# Patient Record
Sex: Female | Born: 1945 | Race: White | Hispanic: No | Marital: Married | Smoking: Never smoker
Health system: Southern US, Community
[De-identification: ages and names within clinical notes are randomized; demographics above are authoritative.]

## PROBLEM LIST (undated history)

## (undated) DIAGNOSIS — I4891 Unspecified atrial fibrillation: Secondary | ICD-10-CM

## (undated) DIAGNOSIS — R0600 Dyspnea, unspecified: Secondary | ICD-10-CM

## (undated) DIAGNOSIS — I509 Heart failure, unspecified: Secondary | ICD-10-CM

## (undated) DIAGNOSIS — I10 Essential (primary) hypertension: Secondary | ICD-10-CM

---

## 2021-10-05 ENCOUNTER — Other Ambulatory Visit: Payer: Self-pay

## 2021-10-05 ENCOUNTER — Emergency Department (HOSPITAL_BASED_OUTPATIENT_CLINIC_OR_DEPARTMENT_OTHER): Payer: Medicare HMO

## 2021-10-05 ENCOUNTER — Encounter (HOSPITAL_BASED_OUTPATIENT_CLINIC_OR_DEPARTMENT_OTHER): Payer: Self-pay

## 2021-10-05 ENCOUNTER — Emergency Department (HOSPITAL_BASED_OUTPATIENT_CLINIC_OR_DEPARTMENT_OTHER)
Admission: EM | Admit: 2021-10-05 | Discharge: 2021-10-05 | Disposition: A | Discharge status: Home / Self Care | Payer: Medicare HMO | Attending: Emergency Medicine | Admitting: Emergency Medicine

## 2021-10-05 DIAGNOSIS — L03115 Cellulitis of right lower limb: Secondary | ICD-10-CM | POA: Diagnosis not present

## 2021-10-05 DIAGNOSIS — M7989 Other specified soft tissue disorders: Secondary | ICD-10-CM | POA: Diagnosis present

## 2021-10-05 DIAGNOSIS — R0902 Hypoxemia: Secondary | ICD-10-CM | POA: Diagnosis present

## 2021-10-05 HISTORY — DX: Dyspnea, unspecified: R06.00

## 2021-10-05 HISTORY — DX: Unspecified atrial fibrillation: I48.91

## 2021-10-05 HISTORY — DX: Essential (primary) hypertension: I10

## 2021-10-05 HISTORY — DX: Heart failure, unspecified: I50.9

## 2021-10-05 LAB — CBC WITH DIFFERENTIAL/PLATELET
Abs Immature Granulocytes: 0.06 10*3/uL (ref 0.00–0.07)
Basophils Absolute: 0 10*3/uL (ref 0.0–0.1)
Basophils Relative: 0 %
Eosinophils Absolute: 0.1 10*3/uL (ref 0.0–0.5)
Eosinophils Relative: 1 %
HCT: 36.3 % (ref 36.0–46.0)
Hemoglobin: 11.6 g/dL — ABNORMAL LOW (ref 12.0–15.0)
Immature Granulocytes: 1 %
Lymphocytes Relative: 13 %
Lymphs Abs: 1.1 10*3/uL (ref 0.7–4.0)
MCH: 29.1 pg (ref 26.0–34.0)
MCHC: 32 g/dL (ref 30.0–36.0)
MCV: 91.2 fL (ref 80.0–100.0)
Monocytes Absolute: 0.6 10*3/uL (ref 0.1–1.0)
Monocytes Relative: 7 %
Neutro Abs: 6.8 10*3/uL (ref 1.7–7.7)
Neutrophils Relative %: 78 %
Platelets: 177 10*3/uL (ref 150–400)
RBC: 3.98 MIL/uL (ref 3.87–5.11)
RDW: 15.6 % — ABNORMAL HIGH (ref 11.5–15.5)
WBC: 8.6 10*3/uL (ref 4.0–10.5)
nRBC: 0 % (ref 0.0–0.2)

## 2021-10-05 LAB — BASIC METABOLIC PANEL
Anion gap: 5 (ref 5–15)
BUN: 17 mg/dL (ref 8–23)
CO2: 29 mmol/L (ref 22–32)
Calcium: 9 mg/dL (ref 8.9–10.3)
Chloride: 105 mmol/L (ref 98–111)
Creatinine, Ser: 0.82 mg/dL (ref 0.44–1.00)
GFR, Estimated: >60 mL/min (ref >60)
Glucose, Bld: 134 mg/dL — ABNORMAL HIGH (ref 70–99)
Potassium: 3.6 mmol/L (ref 3.5–5.1)
Sodium: 139 mmol/L (ref 135–145)

## 2021-10-05 MED ORDER — DOXYCYCLINE HYCLATE 100 MG PO CAPS: 100.0000 mg | ORAL_CAPSULE | Freq: Two times a day (BID) | ORAL | 0 refills | Status: DC

## 2021-10-05 NOTE — ED Triage Notes (Signed)
Right knee wound x 1 year - Patient states it is a little more swollen the last two days.  ? ?Patient states yesterday there was more swelling and a color change on the shin. Patient states pain was much worse last night.  ?

## 2021-10-05 NOTE — Discharge Instructions (Addendum)
Follow-up with wound care.  Next week as scheduled.  Take the antibiotic doxycycline for the next 7 days.  Ultrasound study showed no blood clots in the right lower extremity.  Return for any new or worse symptoms.  Follow-up with your doctors as needed. ?

## 2021-10-05 NOTE — ED Provider Notes (Addendum)
?[facility] EMERGENCY DEPARTMENT ?Provider Note ? ? ?CSN: [id number] ?Arrival date & time: 10/05/21  1130 ? ?  ? ?History ? ?No chief complaint on file. ? ? ?Angie Figueroa is a 76 y.o. female. ? ?Patient sent in by wound care for possible DVT.  Patient contacted the nurse about increased swelling and redness to the right lower extremity.  Patient's been being followed by wound care for the past 3 weeks due to a nonhealing ulcer proximal leg area just below the knee.  It is getting a topical antibiotics.  Patient states has been increased redness to the leg lower into the foot for the past 2 weeks.  And wound care week ago saw some of these changes.  Patient denies any fever or chills.  States that she has had some increased swelling and some increased redness.  And some slight increase in pain.  Denies any chest pain or shortness of breath. ? ?Patient is on Eliquis for atrial fibs cardiac monitor shows that she is in sinus rhythm currently.  Patient also seen for hypertension and congestive heart failure. ? ? ?  ? ?Home Medications ?Prior to Admission medications   ?Not on File  ?   ? ?Allergies    ?Patient has no known allergies.   ? ?Review of Systems   ?Review of Systems  ?Constitutional:  Negative for chills and fever.  ?HENT:  Negative for ear pain and sore throat.   ?Eyes:  Negative for pain and visual disturbance.  ?Respiratory:  Negative for cough and shortness of breath.   ?Cardiovascular:  Positive for leg swelling. Negative for chest pain and palpitations.  ?Gastrointestinal:  Negative for abdominal pain and vomiting.  ?Genitourinary:  Negative for dysuria and hematuria.  ?Musculoskeletal:  Negative for arthralgias and back pain.  ?Skin:  Positive for wound. Negative for color change and rash.  ?Neurological:  Negative for seizures and syncope.  ?All other systems reviewed and are negative. ? ?Physical Exam ?Updated Vital Signs ?BP (!) 152/71   Pulse 76   Temp 98.7 ?F (37.1 ?C) (Oral)    Resp (!) 25   Ht 1.626 m (5\' 4" )   Wt 106.6 kg   SpO2 91%   BMI 40.34 kg/m?  ?Physical Exam ?Vitals and nursing note reviewed.  ?Constitutional:   ?   General: She is not in acute distress. ?   Appearance: Normal appearance. She is well-developed. She is not ill-appearing.  ?HENT:  ?   Head: Normocephalic and atraumatic.  ?Eyes:  ?   Conjunctiva/sclera: Conjunctivae normal.  ?Cardiovascular:  ?   Rate and Rhythm: Normal rate and regular rhythm.  ?   Heart sounds: No murmur heard. ?Pulmonary:  ?   Effort: Pulmonary effort is normal. No respiratory distress.  ?   Breath sounds: Normal breath sounds. No wheezing, rhonchi or rales.  ?Abdominal:  ?   Palpations: Abdomen is soft.  ?   Tenderness: There is no abdominal tenderness.  ?Musculoskeletal:     ?   General: Tenderness present. No swelling.  ?   Cervical back: Normal range of motion and neck supple.  ?   Right lower leg: Edema present.  ?   Left lower leg: Edema present.  ?   Comments: Wound just distal to the right knee no significant erythema there.  Wound bed looks good.  But there is lots of erythema below that on the anterior shin with increased warmth down to the level of the ankle little bit  of erythema on the feet patient states has been there for 2 weeks.  Dorsalis pedis pulse both feet is 2+.  Good cap refill to both feet.  Increased swelling to the right lower extremity compared to the left.  No significant erythema to the anterior part of the left leg.  ?Skin: ?   General: Skin is warm and dry.  ?   Capillary Refill: Capillary refill takes less than 2 seconds.  ?Neurological:  ?   General: No focal deficit present.  ?   Mental Status: She is alert and oriented to person, place, and time.  ?Psychiatric:     ?   Mood and Affect: Mood normal.  ? ? ?ED Results / Procedures / Treatments   ?Labs ?(all labs ordered are listed, but only abnormal results are displayed) ?Labs Reviewed  ?CBC WITH DIFFERENTIAL/PLATELET - Abnormal; Notable for the following  components:  ?    Result Value  ? Hemoglobin 11.6 (*)   ? RDW 15.6 (*)   ? All other components within normal limits  ?BASIC METABOLIC PANEL  ? ? ?EKG ?EKG Interpretation ? ?Date/Time:  Thursday Oct 05 2021 11:55:56 EDT ?Ventricular Rate:  78 ?PR Interval:  221 ?QRS Duration: 108 ?QT Interval:  427 ?QTC Calculation: 487 ?R Axis:   -24 ?Text Interpretation: Sinus rhythm Prolonged PR interval Borderline left axis deviation Borderline prolonged QT interval No previous ECGs available Confirmed by Vanetta Mulders [phone]) on 10/05/2021 12:02:08 PM ? ?Radiology ?No results found. ? ?Procedures ?Procedures  ? ? ?Medications Ordered in ED ?Medications - No data to display ? ?ED Course/ Medical Decision Making/ A&P ?  ?                        ?Medical Decision Making ?Amount and/or Complexity of Data Reviewed ?Labs: ordered. ? ? ?Clinically seems to have a cellulitis to the right anterior leg.  Will probably be amenable to oral antibiotics if the deep vein thrombosis study is negative.  Patient has not been on antibiotics recently.  Just been getting topical antibiotic to the wound that is just distal to the right part of the knee. ? ?We will get Doppler study.  I will get basic labs CBC and basic metabolic panel. ? ?Nursing informing that patient was placed on 2 L of oxygen for oxygen sats at 91%.  Took the patient off the oxygen.  Oxygen sats stay above 90%.  Patient without any significant plaint of shortness of breath at this time and did not have any prior.  But says she was slumped over at the time that she felt a little short of breath. ? ?Patient's Doppler studies were negative. ? ?Patient's CBC normal no leukocytosis hemoglobin 11.6.  Basic metabolic panel normal renal function normal. ? ?We will treat patient for probable cellulitis and has she has follow-up with wound care scheduled for next week.  We will put her on doxycycline for 7 days.  Patient wants a prescription sent to the [facility]. ? ?Final  Clinical Impression(s) / ED Diagnoses ?Final diagnoses:  ?Cellulitis of right lower extremity  ? ? ?Rx / DC Orders ?ED Discharge Orders   ? ? None  ? ?  ? ? ?  ?Vanetta Mulders, MD ?10/05/21 1317 ? ?  ?Vanetta Mulders, MD ?10/05/21 1445 ? ?

## 2021-10-05 NOTE — Progress Notes (Signed)
RT in to assess pt due to endorsing shortness of breath. Upon arrival pt in no distress and pt able to speak in complete sentences. Pt repositioned in bed with RN. SpO2 as low as 87% on room air but quick to rise to 90-91%. Pt placed on 2L nasal cannula at this time. Fine crackles heard bilaterally. RT will continue to monitor and be available as needed.  ?

## 2021-10-05 NOTE — ED Notes (Signed)
Dc instructions and scripts reviewed with pt no questions or concerns at this time. Will follow up with wound care and pcp. Pt wheeled out to vehicle and transported home by spouse.  ?

## 2021-10-05 NOTE — ED Notes (Signed)
EDP at bedside  

## 2022-03-22 ENCOUNTER — Emergency Department (HOSPITAL_BASED_OUTPATIENT_CLINIC_OR_DEPARTMENT_OTHER)
Admission: EM | Admit: 2022-03-22 | Discharge: 2022-03-22 | Disposition: A | Discharge status: Home / Self Care | Payer: Medicare HMO | Attending: Emergency Medicine | Admitting: Emergency Medicine

## 2022-03-22 ENCOUNTER — Encounter (HOSPITAL_BASED_OUTPATIENT_CLINIC_OR_DEPARTMENT_OTHER): Payer: Self-pay | Admitting: Emergency Medicine

## 2022-03-22 ENCOUNTER — Other Ambulatory Visit: Payer: Self-pay

## 2022-03-22 DIAGNOSIS — I509 Heart failure, unspecified: Secondary | ICD-10-CM | POA: Diagnosis not present

## 2022-03-22 DIAGNOSIS — S0232XA Fracture of orbital floor, left side, initial encounter for closed fracture: Secondary | ICD-10-CM | POA: Diagnosis not present

## 2022-03-22 DIAGNOSIS — Y92003 Bedroom of unspecified non-institutional (private) residence as the place of occurrence of the external cause: Secondary | ICD-10-CM | POA: Diagnosis not present

## 2022-03-22 DIAGNOSIS — I110 Hypertensive heart disease with heart failure: Secondary | ICD-10-CM | POA: Insufficient documentation

## 2022-03-22 DIAGNOSIS — W06XXXA Fall from bed, initial encounter: Secondary | ICD-10-CM | POA: Insufficient documentation

## 2022-03-22 DIAGNOSIS — Z7901 Long term (current) use of anticoagulants: Secondary | ICD-10-CM | POA: Diagnosis not present

## 2022-03-22 DIAGNOSIS — S0592XA Unspecified injury of left eye and orbit, initial encounter: Secondary | ICD-10-CM | POA: Diagnosis present

## 2022-03-22 NOTE — ED Notes (Addendum)
Pt awake and alert- GCS 15- speech clear.  L eye periorbital edema with ecchymosis noted --  L upper facial region sutures intact; site cleansed with light saline then covered with light coat of Bacitracin- site covered with covered with dry gauze and secured with tape (wound care by ED tech) -- well tolerated by pt.  Pt does not verbalize any c/o vision changes or pain -- RR even and unlabored on RA with symmetrical rise and fall of chest.  Pt and spouse, at bedside, agreeable with d/c plan as discussed by provider - pt escorted to exit via w/c by ED tech

## 2022-03-22 NOTE — ED Provider Notes (Signed)
[facility] EMERGENCY DEPARTMENT Provider Note   CSN: [id number] Arrival date & time: 03/22/22  1752     History  Chief Complaint  Patient presents with   Facial Injury   Eye Injury    Angie Figueroa is a 76 y.o. female.  Patient is a 76 year old female with a history of atrial fibrillation, hypertension, CHF who is currently on Eliquis and presents today for follow-up after an injury yesterday.  Patient was on vacation in the [place] when she accidentally rolled out of bed hitting the left side of her face on an end table in the floor.  She was seen at an emergency room in the [facility] and at that time had a CT scan done of her face that showed inferior orbital wall fracture with some blood in her sinus but no obvious extraocular entrapment.  There was no evidence of globe rupture.  Patient's wound was repaired in the emergency room and at that time they wanted to transfer her to [place] for specialist evaluation.  They noted that she had normal extraocular movements at that time but her eye was very swollen and they did not feel that they could get an accurate visual exam.  Patient choose to leave AMA and came here for follow-up today.  She reports overall she feels okay.  She feels like the swelling has improved.  She has had no nausea or vomiting.  The history is provided by the patient and medical records.  Facial Injury Eye Injury       Home Medications Prior to Admission medications   Medication Sig Start Date End Date Taking? Authorizing Provider  doxycycline (VIBRAMYCIN) 100 MG capsule Take 1 capsule (100 mg total) by mouth 2 (two) times daily. 10/05/21   Fredia Sorrow, MD      Allergies    Pregabalin    Review of Systems   Review of Systems  Physical Exam Updated Vital Signs BP (!) 157/61 (BP Location: Left Arm)   Pulse 70   Temp 98 F (36.7 C) (Oral)   Resp 17   Ht 5\' 4"  (1.626 m)   Wt 107 kg   SpO2 96%   BMI 40.49 kg/m  Physical  Exam Vitals and nursing note reviewed.  Constitutional:      Appearance: Normal appearance.  HENT:     Head:     Comments: Laceration repaired with multiple sutures underneath the left eye.  Surrounding ecchymosis of the upper and lower lid.  Dried blood in the corner of the eye. Eyes:     Extraocular Movements: Extraocular movements intact.     Pupils: Pupils are equal, round, and reactive to light.     Comments: Normal extraocular movements of the left eye.  Mild subconjunctival hemorrhage present.  Pupils are reactive bilaterally and 3 cm.  Grossly intact vision per patient  Skin:    General: Skin is warm.  Neurological:     Mental Status: She is alert and oriented to person, place, and time. Mental status is at baseline.  Psychiatric:        Mood and Affect: Mood normal.     ED Results / Procedures / Treatments   Labs (all labs ordered are listed, but only abnormal results are displayed) Labs Reviewed - No data to display  EKG None  Radiology No results found.  Procedures Procedures    Medications Ordered in ED Medications - No data to display  ED Course/ Medical Decision Making/ A&P  Medical Decision Making  Patient presenting today for recheck after a fall yesterday and found to have orbital floor fracture.  Patient has no further complaints today except for the wound had been bleeding where they could not put a stitch.  She feels like her vision is okay as long as her eyelid is moved out of the way.  She is not having significant eye pain.  Based on outside records imaging from yesterday shows fracture but no signs of entrapment or open globe.  Patient's vision is intact here and extraocular movements are intact.  Feel that she will need follow-up with maxillofacial team but does not need any acute intervention today.  Wound care applied.  Findings discussed with the patient and her husband.  No indication for further imaging at this time.   Patient is well-appearing otherwise and feel that she is stable for discharge home.  She was given instructions on how to manage her wound and when sutures need to be removed.        Final Clinical Impression(s) / ED Diagnoses Final diagnoses:  Closed fracture of left orbital floor, initial encounter [facility], [place])    Rx / DC Orders ED Discharge Orders     None         Gwyneth Sprout, MD 03/22/22 2005

## 2022-03-22 NOTE — ED Triage Notes (Addendum)
Pt reports rolling off of a bed and hitting her left eye/face on the corner of the bedside table. Fall occurred yesterday around 4 pm. Denies LOC. States she was seen at another ER in [facility], [place] yesterday. Received ~ 12 stiches in the left side of her face and told there was a closed left orbital fracture. That ER was trying to send pt to another hospital in [place] for ophthalmology consult, but pt signed out [place]. Here today for recheck on injury.   Left eye swollen with hematoma. Sutures covered with bandage. + blood thinners (Eliquis) - pt did take todays dose.

## 2022-03-22 NOTE — Discharge Instructions (Signed)
It is okay to take a shower and it is okay to get the stitches wet just do not soak them or scrub on them.  You can place a bandage or you can leave it open to the air.  Just put some Vaseline over it.  The stitches need to come out in 5 days.  Also it is important that you follow-up with the specialist in 1 to 2 weeks just to make sure everything is healing well and that your vision is unchanged.

## 2022-12-25 IMAGING — US US EXTREM LOW VENOUS*R*
1 series · 14 of 24 positions shown · non-contrast
Comparison: None Available.

CLINICAL DATA: Right knee nonhealing wound.

EXAM:
RIGHT LOWER EXTREMITY VENOUS DOPPLER ULTRASOUND
TECHNIQUE: Gray-scale sonography with compression, as well as color and duplex
ultrasound, were performed to evaluate the deep venous system(s)
from the level of the common femoral vein through the popliteal and
proximal calf veins.

[Series 1: us extrem low venous*right* · 14 of 41 slices shown]
[im 1/41]
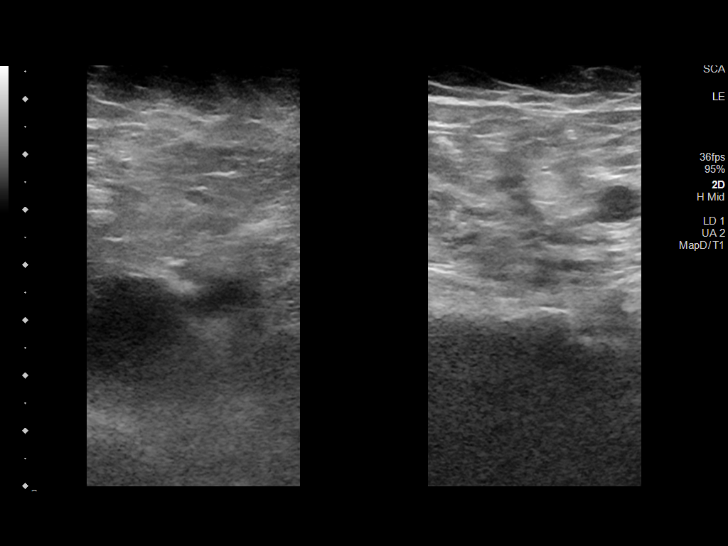
[im 4/41]
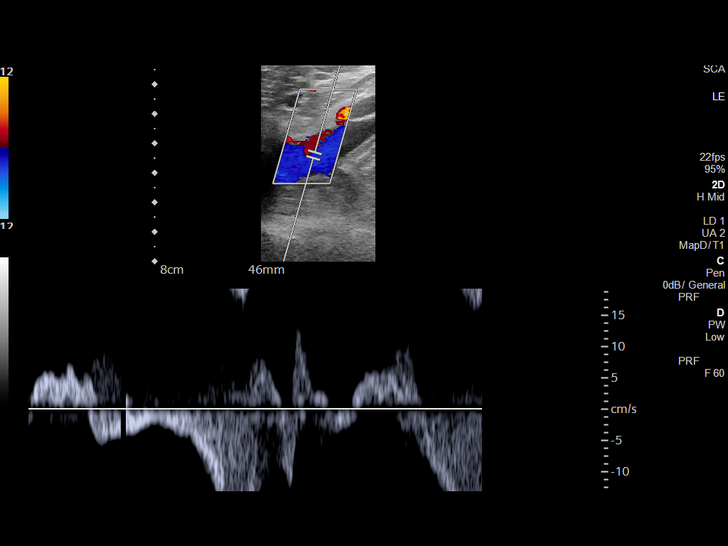
[im 7/41]
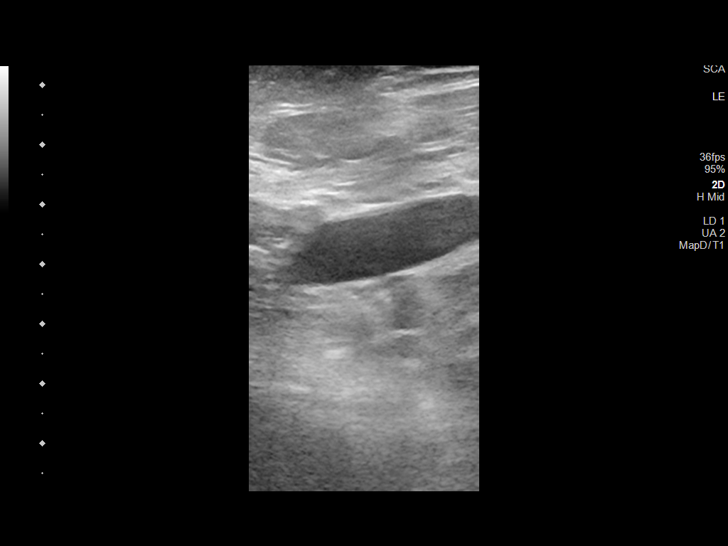
[im 11/41]
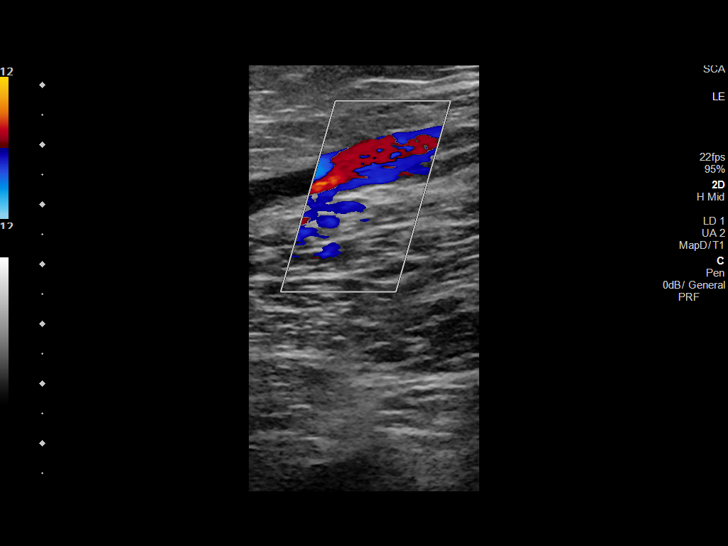
[im 13/41]
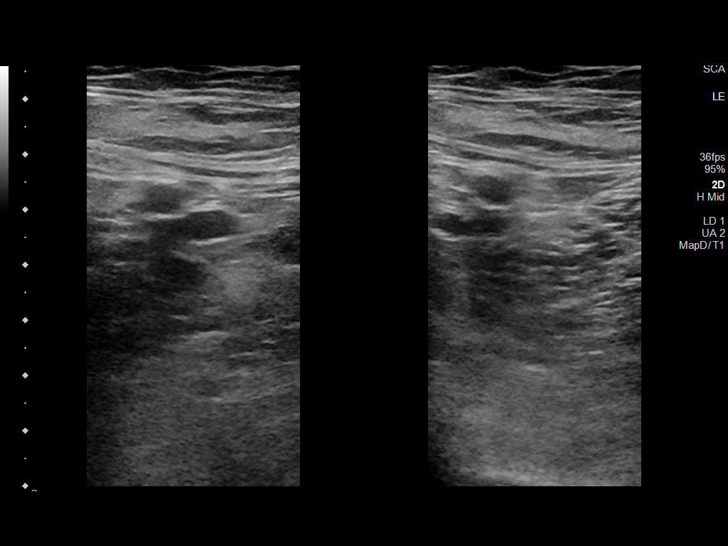
[im 16/41]
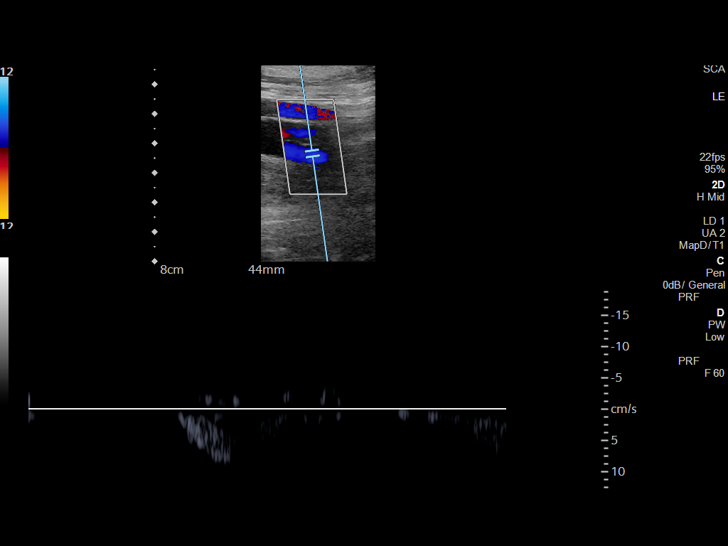
[im 20/41]
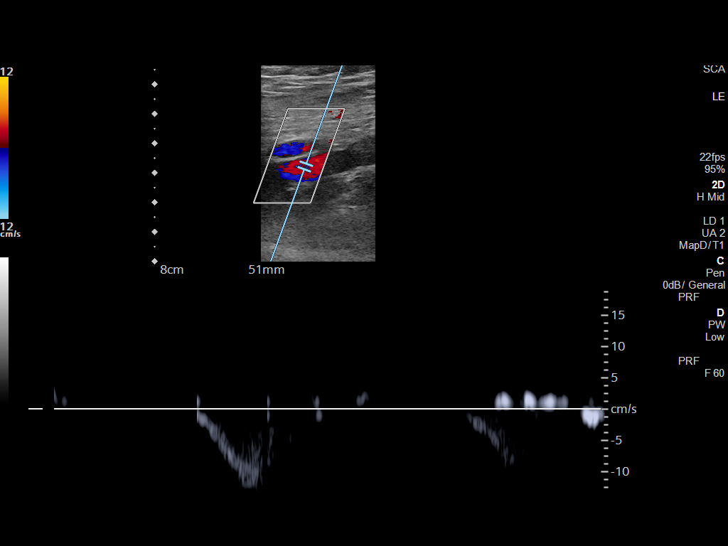
[im 21/41]
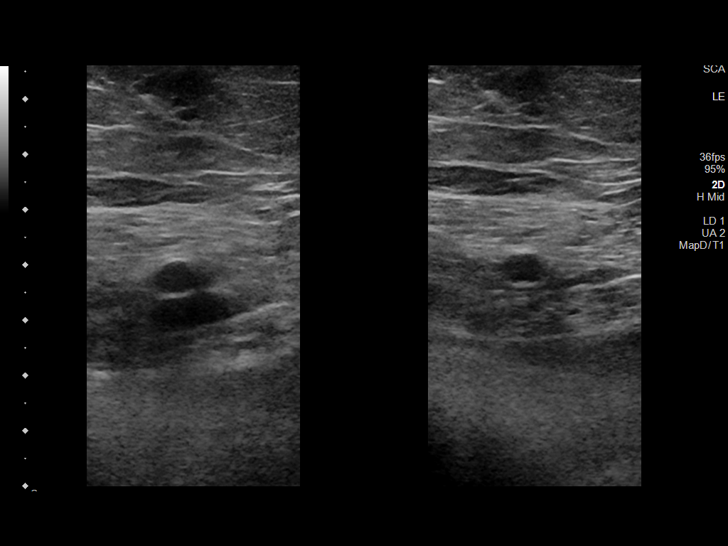
[im 25/41]
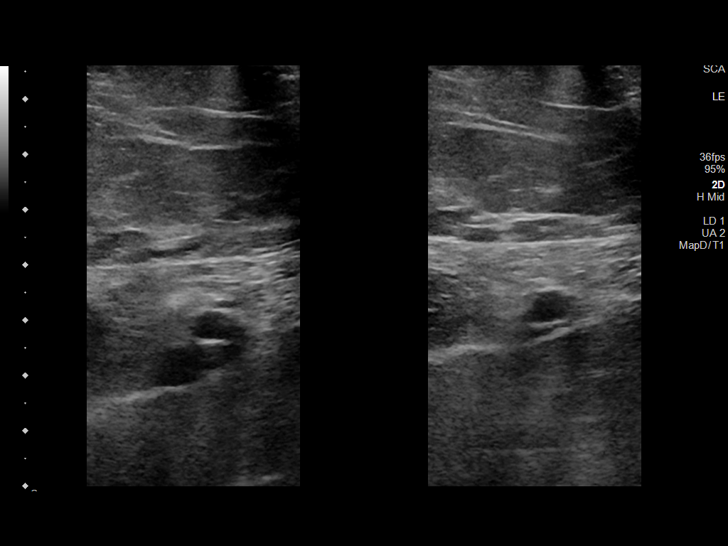
[im 28/41]
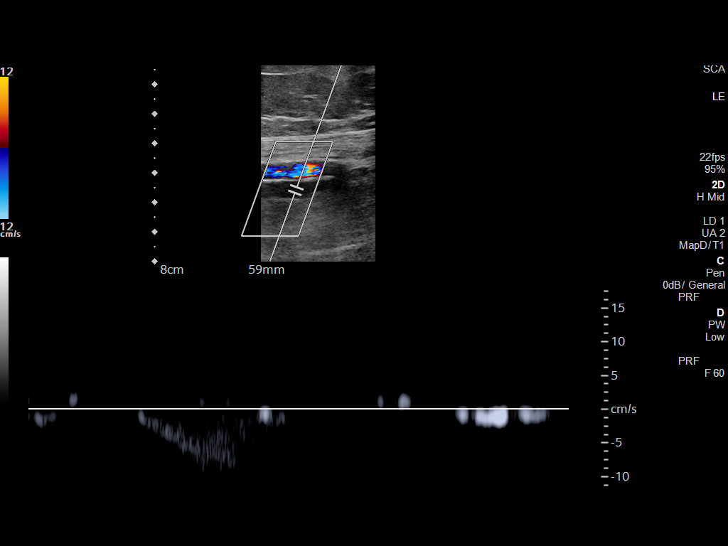
[im 32/41]
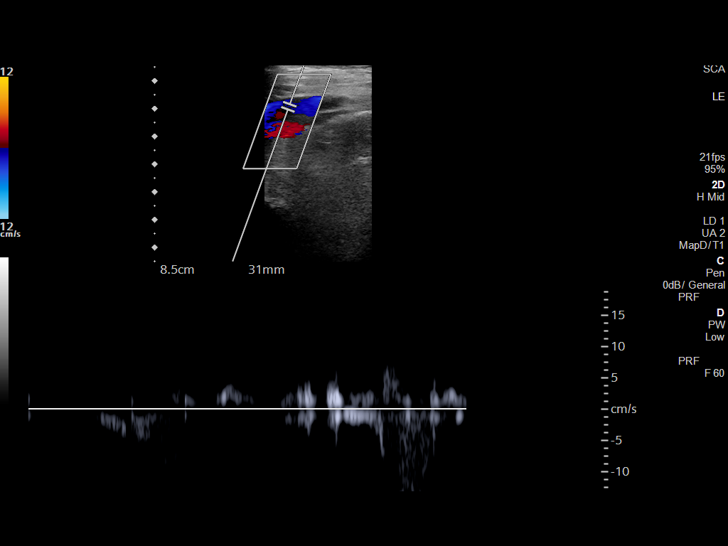
[im 34/41]
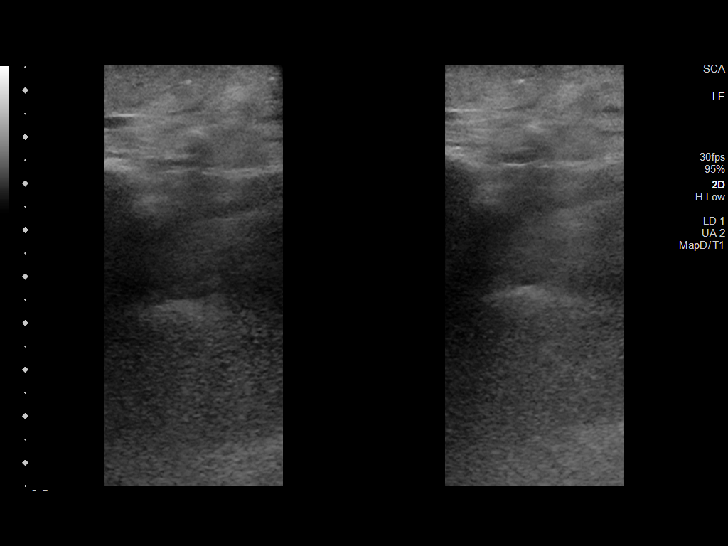
[im 37/41]
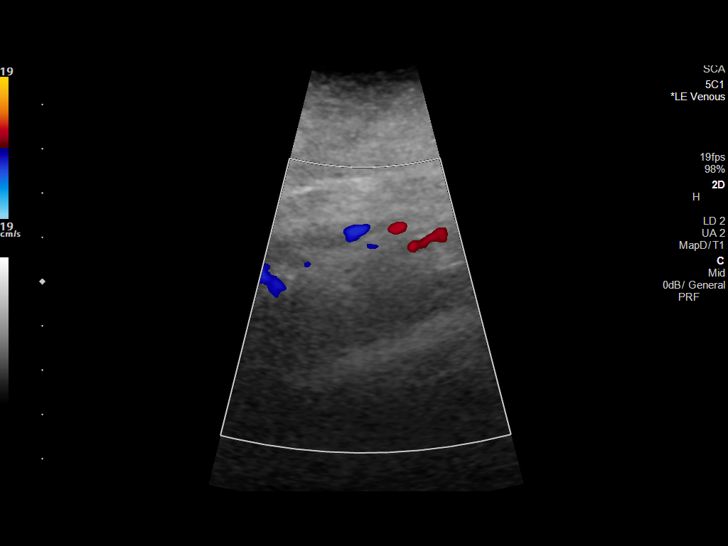
[im 41/41]
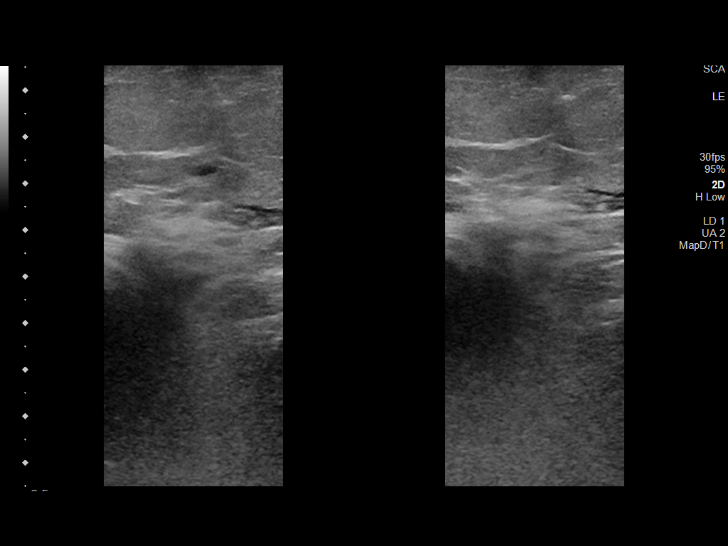

[14 of 24 positions shown; findings below may reference images not displayed]

FINDINGS: VENOUS

Normal compressibility of the common femoral, superficial femoral,
and popliteal veins, as well as the visualized calf veins. Of note,
there is poor visualization of the calf veins. Visualized portions
of profunda femoral vein and great saphenous vein unremarkable. No
filling defects to suggest DVT on grayscale or color Doppler
imaging. Doppler waveforms show normal direction of venous flow,
normal respiratory plasticity and response to augmentation.

Limited views of the contralateral common femoral vein are
unremarkable.

OTHER

None.

Limitations: none
IMPRESSION: 1. No evidence of deep venous thrombosis.
2. Of note, limited visualization of the calf veins.

## 2023-10-11 ENCOUNTER — Encounter (HOSPITAL_BASED_OUTPATIENT_CLINIC_OR_DEPARTMENT_OTHER): Payer: Self-pay

## 2023-10-11 ENCOUNTER — Inpatient Hospital Stay (HOSPITAL_BASED_OUTPATIENT_CLINIC_OR_DEPARTMENT_OTHER)
Admission: EM | Admit: 2023-10-11 | Discharge: 2023-10-15 | DRG: 291 | Disposition: A | Discharge status: Home Health | Attending: Internal Medicine | Admitting: Internal Medicine

## 2023-10-11 ENCOUNTER — Other Ambulatory Visit: Payer: Self-pay

## 2023-10-11 ENCOUNTER — Emergency Department (HOSPITAL_BASED_OUTPATIENT_CLINIC_OR_DEPARTMENT_OTHER)

## 2023-10-11 DIAGNOSIS — I130 Hypertensive heart and chronic kidney disease with heart failure and stage 1 through stage 4 chronic kidney disease, or unspecified chronic kidney disease: Principal | ICD-10-CM | POA: Diagnosis present

## 2023-10-11 DIAGNOSIS — Z888 Allergy status to other drugs, medicaments and biological substances status: Secondary | ICD-10-CM | POA: Diagnosis not present

## 2023-10-11 DIAGNOSIS — Z6841 Body Mass Index (BMI) 40.0 and over, adult: Secondary | ICD-10-CM | POA: Diagnosis not present

## 2023-10-11 DIAGNOSIS — E039 Hypothyroidism, unspecified: Secondary | ICD-10-CM | POA: Diagnosis present

## 2023-10-11 DIAGNOSIS — I890 Lymphedema, not elsewhere classified: Secondary | ICD-10-CM | POA: Diagnosis not present

## 2023-10-11 DIAGNOSIS — I878 Other specified disorders of veins: Secondary | ICD-10-CM | POA: Diagnosis present

## 2023-10-11 DIAGNOSIS — I4819 Other persistent atrial fibrillation: Secondary | ICD-10-CM | POA: Diagnosis present

## 2023-10-11 DIAGNOSIS — I5033 Acute on chronic diastolic (congestive) heart failure: Secondary | ICD-10-CM | POA: Diagnosis present

## 2023-10-11 DIAGNOSIS — G8929 Other chronic pain: Secondary | ICD-10-CM | POA: Diagnosis not present

## 2023-10-11 DIAGNOSIS — E876 Hypokalemia: Secondary | ICD-10-CM | POA: Diagnosis present

## 2023-10-11 DIAGNOSIS — F39 Unspecified mood [affective] disorder: Secondary | ICD-10-CM | POA: Diagnosis not present

## 2023-10-11 DIAGNOSIS — Z1152 Encounter for screening for COVID-19: Secondary | ICD-10-CM | POA: Diagnosis not present

## 2023-10-11 DIAGNOSIS — Z7901 Long term (current) use of anticoagulants: Secondary | ICD-10-CM

## 2023-10-11 DIAGNOSIS — D649 Anemia, unspecified: Secondary | ICD-10-CM | POA: Insufficient documentation

## 2023-10-11 DIAGNOSIS — Z79899 Other long term (current) drug therapy: Secondary | ICD-10-CM

## 2023-10-11 DIAGNOSIS — D509 Iron deficiency anemia, unspecified: Secondary | ICD-10-CM | POA: Diagnosis not present

## 2023-10-11 DIAGNOSIS — N1831 Chronic kidney disease, stage 3a: Secondary | ICD-10-CM | POA: Diagnosis present

## 2023-10-11 DIAGNOSIS — I509 Heart failure, unspecified: Principal | ICD-10-CM

## 2023-10-11 DIAGNOSIS — R54 Age-related physical debility: Secondary | ICD-10-CM | POA: Diagnosis present

## 2023-10-11 DIAGNOSIS — R296 Repeated falls: Secondary | ICD-10-CM | POA: Diagnosis not present

## 2023-10-11 LAB — CBC
HCT: 32.2 % — ABNORMAL LOW (ref 36.0–46.0)
Hemoglobin: 9.3 g/dL — ABNORMAL LOW (ref 12.0–15.0)
MCH: 24.7 pg — ABNORMAL LOW (ref 26.0–34.0)
MCHC: 28.9 g/dL — ABNORMAL LOW (ref 30.0–36.0)
MCV: 85.6 fL (ref 80.0–100.0)
Platelets: 272 10*3/uL (ref 150–400)
RBC: 3.76 MIL/uL — ABNORMAL LOW (ref 3.87–5.11)
RDW: 16.8 % — ABNORMAL HIGH (ref 11.5–15.5)
WBC: 8.6 10*3/uL (ref 4.0–10.5)
nRBC: 0 % (ref 0.0–0.2)

## 2023-10-11 LAB — BASIC METABOLIC PANEL WITH GFR
Anion gap: 15 (ref 5–15)
BUN: 11 mg/dL (ref 8–23)
CO2: 25 mmol/L (ref 22–32)
Calcium: 9.2 mg/dL (ref 8.9–10.3)
Chloride: 103 mmol/L (ref 98–111)
Creatinine, Ser: 0.98 mg/dL (ref 0.44–1.00)
GFR, Estimated: 58 mL/min — ABNORMAL LOW (ref >60)
Glucose, Bld: 91 mg/dL (ref 70–99)
Potassium: 3.1 mmol/L — ABNORMAL LOW (ref 3.5–5.1)
Sodium: 143 mmol/L (ref 135–145)

## 2023-10-11 LAB — RESP PANEL BY RT-PCR (RSV, FLU A&B, COVID)  RVPGX2
Influenza A by PCR: NEGATIVE
Influenza B by PCR: NEGATIVE
Resp Syncytial Virus by PCR: NEGATIVE
SARS Coronavirus 2 by RT PCR: NEGATIVE

## 2023-10-11 LAB — TROPONIN T, HIGH SENSITIVITY
Troponin T High Sensitivity: 18 ng/L (ref <19)
Troponin T High Sensitivity: 18 ng/L (ref <19)

## 2023-10-11 LAB — PRO BRAIN NATRIURETIC PEPTIDE: Pro Brain Natriuretic Peptide: 1179 pg/mL — ABNORMAL HIGH (ref <300.0)

## 2023-10-11 MED ORDER — MELATONIN 3 MG PO TABS
6.0000 mg | ORAL_TABLET | Freq: Every evening | ORAL | Status: DC | PRN
  Administered 2023-10-13 – 2023-10-14 (×3): 6 mg via ORAL
  Filled 2023-10-11 (×3): qty 2

## 2023-10-11 MED ORDER — FUROSEMIDE 10 MG/ML IJ SOLN
40.0000 mg | Freq: Every day | INTRAMUSCULAR | Status: DC
  Administered 2023-10-12 (×2): 40 mg via INTRAVENOUS
  Filled 2023-10-11 (×2): qty 4

## 2023-10-11 MED ORDER — TIZANIDINE HCL 4 MG PO TABS
4.0000 mg | ORAL_TABLET | Freq: Four times a day (QID) | ORAL | Status: DC | PRN
  Administered 2023-10-12 – 2023-10-14 (×4): 4 mg via ORAL
  Filled 2023-10-11 (×4): qty 1

## 2023-10-11 MED ORDER — AMIODARONE HCL 200 MG PO TABS: 200.0000 mg | ORAL_TABLET | Freq: Two times a day (BID) | ORAL | Status: DC

## 2023-10-11 MED ORDER — SODIUM CHLORIDE 0.9% FLUSH
3.0000 mL | Freq: Two times a day (BID) | INTRAVENOUS | Status: DC
  Administered 2023-10-12 – 2023-10-15 (×7): 3 mL via INTRAVENOUS

## 2023-10-11 MED ORDER — FUROSEMIDE 10 MG/ML IJ SOLN
40.0000 mg | INTRAMUSCULAR | Status: AC
  Administered 2023-10-11: 40 mg via INTRAVENOUS
  Filled 2023-10-11: qty 4

## 2023-10-11 MED ORDER — SPIRONOLACTONE 25 MG PO TABS
25.0000 mg | ORAL_TABLET | Freq: Every day | ORAL | Status: DC
  Administered 2023-10-12 – 2023-10-15 (×4): 25 mg via ORAL
  Filled 2023-10-11 (×4): qty 1

## 2023-10-11 MED ORDER — AMIODARONE HCL 200 MG PO TABS
200.0000 mg | ORAL_TABLET | Freq: Two times a day (BID) | ORAL | Status: DC
  Administered 2023-10-12 – 2023-10-15 (×8): 200 mg via ORAL
  Filled 2023-10-11 (×8): qty 1

## 2023-10-11 MED ORDER — ONDANSETRON HCL 4 MG/2ML IJ SOLN: 4.0000 mg | Freq: Four times a day (QID) | INTRAMUSCULAR | Status: DC | PRN

## 2023-10-11 MED ORDER — POTASSIUM CHLORIDE CRYS ER 20 MEQ PO TBCR
40.0000 meq | EXTENDED_RELEASE_TABLET | Freq: Once | ORAL | Status: AC
  Administered 2023-10-12: 40 meq via ORAL
  Filled 2023-10-11: qty 2

## 2023-10-11 MED ORDER — POLYETHYLENE GLYCOL 3350 17 G PO PACK: 17.0000 g | PACK | Freq: Every day | ORAL | Status: DC | PRN

## 2023-10-11 MED ORDER — LAMOTRIGINE 100 MG PO TABS
50.0000 mg | ORAL_TABLET | Freq: Two times a day (BID) | ORAL | Status: DC
  Administered 2023-10-12 – 2023-10-15 (×8): 50 mg via ORAL
  Filled 2023-10-11 (×8): qty 1

## 2023-10-11 MED ORDER — HYDRALAZINE HCL 25 MG PO TABS
25.0000 mg | ORAL_TABLET | Freq: Three times a day (TID) | ORAL | Status: DC
  Administered 2023-10-12 (×3): 25 mg via ORAL
  Filled 2023-10-11 (×3): qty 1

## 2023-10-11 MED ORDER — POTASSIUM CHLORIDE CRYS ER 20 MEQ PO TBCR
40.0000 meq | EXTENDED_RELEASE_TABLET | Freq: Once | ORAL | Status: AC
Start: 2023-10-11 — End: 2023-10-11
  Administered 2023-10-11: 40 meq via ORAL
  Filled 2023-10-11: qty 2

## 2023-10-11 MED ORDER — APIXABAN 5 MG PO TABS
5.0000 mg | ORAL_TABLET | Freq: Two times a day (BID) | ORAL | Status: DC
  Administered 2023-10-12 – 2023-10-15 (×7): 5 mg via ORAL
  Filled 2023-10-11 (×7): qty 1

## 2023-10-11 MED ORDER — ACETAMINOPHEN 500 MG PO TABS
1000.0000 mg | ORAL_TABLET | Freq: Four times a day (QID) | ORAL | Status: DC | PRN
  Administered 2023-10-12: 1000 mg via ORAL
  Filled 2023-10-11: qty 2

## 2023-10-11 MED ORDER — HYDROCODONE-ACETAMINOPHEN 5-325 MG PO TABS
1.0000 | ORAL_TABLET | Freq: Four times a day (QID) | ORAL | Status: DC | PRN
  Administered 2023-10-14 (×2): 1 via ORAL
  Filled 2023-10-11 (×2): qty 1

## 2023-10-11 NOTE — ED Notes (Signed)
Changed out purewick canister 

## 2023-10-11 NOTE — ED Notes (Signed)
 Labs resent for BMP. Original specimen hemolyzed.

## 2023-10-11 NOTE — ED Notes (Signed)
 [facility] called for transport.

## 2023-10-11 NOTE — Plan of Care (Signed)
 78 year old female which according to ED provider has history of congestive heart failure/type unknown since there is no echo in the chart, came in with shortness of breath, orthopnea for last couple of days.  She was diagnosed with acute CHF exacerbation with pulmonary vascular congestion with chest x-ray, elevated BNP and 3+ pitting edema.  She is not hypoxic, received Lasix in the ED.  Hypokalemic, received potassium replacement.  Per ED physician, right lower extremity appeared more swollen than the left so she ended up having right lower extremity venous ultrasound and DVT was ruled out.  She is otherwise hemodynamically stable.  Patient accepted to [facility] per ED physician's request for CHF exacerbation.

## 2023-10-11 NOTE — ED Provider Notes (Signed)
 [place] [facility] AT [facility] Provider Note   CSN: [id number] Arrival date & time: 10/11/23  1327     History  Chief Complaint  Patient presents with   Shortness of Breath   Leg Swelling    Angie Figueroa is a 78 y.o. female with PMHx afib, CHF, HTN who presents to ED concerned for SOB and increased leg swelling x3 days. Patient with wound on right leg from a fall 1 month ago which she goes to wound care for and she states it is healing well.   Patient endorsing compliance on lasix and states that she is urinating plenty.   Denies fever, chest pain, nausea, vomiting, diarrhea.    Shortness of Breath      Home Medications Prior to Admission medications   Medication Sig Start Date End Date Taking? Authorizing Provider  amiodarone (PACERONE) 200 MG tablet Take 200 mg by mouth daily. Twice daily   Yes [provider]  apixaban (ELIQUIS) 5 MG TABS tablet Take 5 mg by mouth 2 (two) times daily.   Yes [provider]  Cholecalciferol 25 MCG (1000 UT) capsule Take 1,000 Units by mouth daily.   Yes [provider]  diltiazem (CARDIZEM SR) 90 MG 12 hr capsule Take 90 mg by mouth 2 (two) times daily.   Yes [provider]  furosemide (LASIX) 20 MG tablet Take 40 mg by mouth daily.   Yes [provider]  hydrALAZINE (APRESOLINE) 25 MG tablet Take 25 mg by mouth 3 (three) times daily.   Yes [provider]  HYDROcodone-acetaminophen (NORCO/VICODIN) 5-325 MG tablet Take 1 tablet by mouth every 6 (six) hours as needed for moderate pain (pain score 4-6).   Yes [provider]  lamoTRIgine (LAMICTAL) 25 MG tablet Take 25 mg by mouth daily. Two tabs (50mg ) twice daily   Yes [provider]  lidocaine (XYLOCAINE) 2 % solution Use as directed 15 mLs in the mouth or throat as needed for mouth pain.   Yes [provider]  LORazepam (ATIVAN) 0.5 MG tablet Take 0.5 mg by mouth every 8  (eight) hours.   Yes [provider]  omeprazole-sodium bicarbonate (ZEGERID) 40-1100 MG capsule Take 1 capsule by mouth daily before breakfast.   Yes [provider]  ondansetron (ZOFRAN-ODT) 4 MG disintegrating tablet Take 4 mg by mouth every 8 (eight) hours as needed for nausea or vomiting.   Yes [provider]  spironolactone (ALDACTONE) 25 MG tablet Take 25 mg by mouth daily.   Yes [provider]  tiZANidine (ZANAFLEX) 4 MG tablet Take 4 mg by mouth every 6 (six) hours as needed for muscle spasms.   Yes [provider]  triamcinolone cream (KENALOG) 0.1 % Apply 1 Application topically 2 (two) times daily.   Yes [provider]  doxycycline  (VIBRAMYCIN ) 100 MG capsule Take 1 capsule (100 mg total) by mouth 2 (two) times daily. 10/05/21   Zackowski, Scott, MD      Allergies    Pregabalin    Review of Systems   Review of Systems  Respiratory:  Positive for shortness of breath.     Physical Exam Updated Vital Signs BP (!) 151/84 (BP Location: Right Wrist)   Pulse 89   Temp 97.7 F (36.5 C) (Oral)   Resp 20   Ht 5\' 4"  (1.626 m)   Wt 119.7 kg   SpO2 95%   BMI 45.32 kg/m  Physical Exam Vitals and nursing note reviewed.  Constitutional:      General: She is not in acute distress.    Appearance: She is ill-appearing (chronically ill-appearing). She is not toxic-appearing.  HENT:     Head: Normocephalic and atraumatic.     Mouth/Throat:     Mouth: Mucous membranes are moist.     Pharynx: No posterior oropharyngeal erythema.  Eyes:     General: No scleral icterus.       Right eye: No discharge.        Left eye: No discharge.     Conjunctiva/sclera: Conjunctivae normal.  Cardiovascular:     Rate and Rhythm: Normal rate and regular rhythm.     Pulses: Normal pulses.     Heart sounds: Normal heart sounds. No murmur heard. Pulmonary:     Effort: Pulmonary effort is normal. No respiratory distress.     Breath sounds: No  wheezing, rhonchi or rales.  Abdominal:     General: Bowel sounds are normal.     Palpations: Abdomen is soft. There is no mass.     Tenderness: There is no abdominal tenderness.  Musculoskeletal:     Right lower leg: Edema present.     Left lower leg: Edema present.     Comments: +3 pitting edema BL  Skin:    General: Skin is warm and dry.     Findings: No rash.  Neurological:     General: No focal deficit present.     Mental Status: She is alert and oriented to person, place, and time. Mental status is at baseline.  Psychiatric:        Mood and Affect: Mood normal.        Behavior: Behavior normal.     ED Results / Procedures / Treatments   Labs (all labs ordered are listed, but only abnormal results are displayed) Labs Reviewed  CBC - Abnormal; Notable for the following components:      Result Value   RBC 3.76 (*)    Hemoglobin 9.3 (*)    HCT 32.2 (*)    MCH 24.7 (*)    MCHC 28.9 (*)    RDW 16.8 (*)    All other components within normal limits  PRO BRAIN NATRIURETIC PEPTIDE - Abnormal; Notable for the following components:   Pro Brain Natriuretic Peptide 1,179.0 (*)    All other components within normal limits  BASIC METABOLIC PANEL WITH GFR - Abnormal; Notable for the following components:   Potassium 3.1 (*)    GFR, Estimated 58 (*)    All other components within normal limits  TROPONIN T, HIGH SENSITIVITY  TROPONIN T, HIGH SENSITIVITY    EKG EKG Interpretation Date/Time:  Friday Oct 11 2023 13:37:11 EDT Ventricular Rate:  94 PR Interval:    QRS Duration:  94 QT Interval:  396 QTC Calculation: 496 R Axis:   1  Text Interpretation: Atrial fibrillation Low voltage, precordial leads Anteroseptal infarct, old Confirmed by Elise Guile [phone]) on 10/11/2023 2:07:27 PM  Radiology US  Venous Img Lower Right (DVT Study) Result Date: 10/11/2023 CLINICAL DATA:  Right sided leg pain and swelling EXAM: Right LOWER EXTREMITY VENOUS DOPPLER ULTRASOUND TECHNIQUE:  Gray-scale sonography with graded compression, as well as color Doppler and duplex ultrasound were performed to evaluate the lower extremity deep venous systems from the level of the common femoral vein and including the common femoral, femoral, profunda femoral, popliteal and calf veins including the posterior tibial, peroneal and gastrocnemius veins when visible. The superficial great saphenous vein was also  interrogated. Spectral Doppler was utilized to evaluate flow at rest and with distal augmentation maneuvers in the common femoral, femoral and popliteal veins. COMPARISON:  Ultrasound 10/05/2021 FINDINGS: Contralateral Common Femoral Vein: Respiratory phasicity is normal and symmetric with the symptomatic side. No evidence of thrombus. Normal compressibility. Common Femoral Vein: No evidence of thrombus. Normal compressibility, respiratory phasicity and response to augmentation. Saphenofemoral Junction: No evidence of thrombus. Normal compressibility and flow on color Doppler imaging. Profunda Femoral Vein: No evidence of thrombus. Normal compressibility and flow on color Doppler imaging. Femoral Vein: No evidence of thrombus. Normal compressibility, respiratory phasicity and response to augmentation. Popliteal Vein: No evidence of thrombus. Normal compressibility, respiratory phasicity and response to augmentation. Calf Veins: No evidence of thrombus. Normal compressibility and flow on color Doppler imaging. Limited visualization of the peroneal vein. Superficial Great Saphenous Vein: No evidence of thrombus. Normal compressibility. Venous Reflux:  None. Other Findings:  Scattered soft tissue edema. IMPRESSION: No evidence of right lower extremity DVT. Electronically Signed   By: Adrianna Horde M.D.   On: 10/11/2023 16:06   DG Chest 2 View Result Date: 10/11/2023 CLINICAL DATA:  Shortness of breath.  Leg pain for 3 days EXAM: CHEST - 2 VIEW COMPARISON:  None Available. FINDINGS: Under penetrated radiographs.  Enlarged cardiopericardial silhouette with some vascular congestion and possible interstitial edema. No pneumothorax or effusion. No consolidation. Overlapping cardiac leads. Films are under penetrated. Degenerative changes along the spine. Calcified aorta. Surgical clips in the upper abdomen. IMPRESSION: Enlarged heart with some central vascular congestion. Interstitial prominence is also seen. Acute versus chronic such as edema. Please correlate with any prior or follow up. Under penetrated radiographs. Electronically Signed   By: Adrianna Horde M.D.   On: 10/11/2023 16:04    Procedures .Critical Care  Performed by: [place] Bureau, PA-C Authorized by: [place] Bureau, PA-C   Critical care provider statement:    Critical care time (minutes):  30   Critical care was necessary to treat or prevent imminent or life-threatening deterioration of the following conditions:  Cardiac failure   Critical care was time spent personally by me on the following activities:  Development of treatment plan with patient or surrogate, discussions with consultants, evaluation of patient's response to treatment, examination of patient, ordering and review of laboratory studies, ordering and review of radiographic studies, ordering and performing treatments and interventions, pulse oximetry, re-evaluation of patient's condition and review of old charts   Care discussed with: admitting provider   Comments:     CHF exacerbation     Medications Ordered in ED Medications  furosemide (LASIX) injection 40 mg (40 mg Intravenous Given 10/11/23 1556)  potassium chloride SA (KLOR-CON M) CR tablet 40 mEq (40 mEq Oral Given 10/11/23 1555)    ED Course/ Medical Decision Making/ A&P                                 Medical Decision Making Amount and/or Complexity of Data Reviewed Labs: ordered. Radiology: ordered.  Risk Prescription drug management.   This patient presents to the ED for concern of shortness of  breath, this involves an extensive number of treatment options, and is a complaint that carries with it a high risk of complications and morbidity.  The differential diagnosis includes Anxiety, Anaphylaxis/Angioedema, Aspirated FB, Arrhythmia, CHF, Asthma, COPD, PNA, COVID/Flu/RSV, STEMI, Tamponade, TPNX, Sepsis   Co morbidities that complicate the patient evaluation  afib, CHF, HTN  Additional history obtained:  Additional history obtained from 5/9 cardiology note: Patient states "swelling & pain in legs, feet, & toes (worse R leg/foot), worse past 3 days". Denies chest pain. States intermittent "SHOB". States "gained 5-6 lb past 2 weeks". Endorses taking meds including fluid pills  03/2023 BNP: 535   Problem List / ED Course / Critical interventions / Medication management  Patient presents to ED concerned for worsening cough, SOB, leg swelling, 6lb weight gain, orthopnea despite compliance on CHF medications x3 days. Physical exam with +3 pitting edema BL. Patient afebrile with stable vitals. I Ordered, and personally interpreted labs.  Initial repeat troponins reassuring.  BMP with mild hypokalemia at 3.1.  CBC without leukocytosis.  There is anemia with hemoglobin at 9.3.  BNP elevated at 1179 which is elevated from prior value of 535. The patient was maintained on a cardiac monitor.  I personally viewed and interpreted the cardiac monitored which showed an underlying rhythm of: afib I ordered imaging studies including chest xray to assess for process contributing to patient's symptoms. I independently visualized and interpreted imaging which showed interstitial edema. I agree with the radiologist interpretation Provided patient with IV lasix and oral potassium supplementation. Patient stating that she still feels poorly and would like to be admitted.  I requested consultation with the Hospitalist Dr. Lilyan Remedies,  and discussed lab and imaging findings as well as pertinent plan - they agree  to admit patient. I have reviewed the patients home medicines and have made adjustments as needed  Social Determinants of Health:  geriatric          Final Clinical Impression(s) / ED Diagnoses Final diagnoses:  Acute on chronic congestive heart failure, unspecified heart failure type [facility])    Rx / DC Orders ED Discharge Orders     None         Theodosia Bureau, [place] 10/11/23 1748    Tonya Fredrickson, MD 10/12/23 1058

## 2023-10-11 NOTE — Progress Notes (Signed)
 New Admission Note:    Arrival Method: Via carelink Mental Orientation: A&Ox4 Telemetry: applied  Assessment:  Skin: See wound LDA. R leg wound , gen bruising , facial bruising post fall from Saturday per pt  IV:  R. FA 22 gauge Pain: 5/10 c/o headache  Tubes:  None  Safety Measures: Safety Fall Prevention Plan has been dicussed pt high fall risk   Admission: Completed Patient has been orientated to the room, unit and staff.  Family:  Husband is at bedside   Admissions have been paged , awaiting a response. Will continue to monitor the patient. Call light has been placed within reach and bed alarm has been activated.      10/11/23 2220  Vitals  Temp 97.8 F (36.6 C)  Temp Source Oral  BP 139/62  MAP (mmHg) 85  BP Location Right Arm  BP Method Automatic  Patient Position (if appropriate) Lying  Pulse Rate 95  Pulse Rate Source Monitor  ECG Heart Rate 95  Resp 18  Level of Consciousness  Level of Consciousness Alert  MEWS COLOR  MEWS Score Color Green  Oxygen Therapy  SpO2 96 %  O2 Device Nasal Cannula  Height and Weight  Weight 116.3 kg  Type of Scale Used Standing  Type of Weight Actual  BMI (Calculated) 43.99  MEWS Score  MEWS Temp 0  MEWS Systolic 0  MEWS Pulse 0  MEWS RR 0  MEWS LOC 0  MEWS Score 0

## 2023-10-11 NOTE — ED Triage Notes (Addendum)
 Arrives with complaints of worsening leg swelling and leg pain x3 days. Patient rates pain a 9/10. Patient is compliant with her lasix.  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

## 2023-10-11 NOTE — ED Notes (Signed)
 U/s at bedside

## 2023-10-12 ENCOUNTER — Observation Stay (HOSPITAL_COMMUNITY)

## 2023-10-12 ENCOUNTER — Other Ambulatory Visit (HOSPITAL_COMMUNITY)

## 2023-10-12 DIAGNOSIS — N1831 Chronic kidney disease, stage 3a: Secondary | ICD-10-CM | POA: Diagnosis present

## 2023-10-12 DIAGNOSIS — I130 Hypertensive heart and chronic kidney disease with heart failure and stage 1 through stage 4 chronic kidney disease, or unspecified chronic kidney disease: Secondary | ICD-10-CM | POA: Diagnosis present

## 2023-10-12 DIAGNOSIS — R54 Age-related physical debility: Secondary | ICD-10-CM | POA: Diagnosis present

## 2023-10-12 DIAGNOSIS — I5033 Acute on chronic diastolic (congestive) heart failure: Secondary | ICD-10-CM

## 2023-10-12 DIAGNOSIS — Z888 Allergy status to other drugs, medicaments and biological substances status: Secondary | ICD-10-CM | POA: Diagnosis not present

## 2023-10-12 DIAGNOSIS — Z79899 Other long term (current) drug therapy: Secondary | ICD-10-CM | POA: Diagnosis not present

## 2023-10-12 DIAGNOSIS — I878 Other specified disorders of veins: Secondary | ICD-10-CM | POA: Diagnosis present

## 2023-10-12 DIAGNOSIS — G8929 Other chronic pain: Secondary | ICD-10-CM | POA: Diagnosis present

## 2023-10-12 DIAGNOSIS — Z1152 Encounter for screening for COVID-19: Secondary | ICD-10-CM | POA: Diagnosis not present

## 2023-10-12 DIAGNOSIS — E876 Hypokalemia: Secondary | ICD-10-CM | POA: Diagnosis present

## 2023-10-12 DIAGNOSIS — E039 Hypothyroidism, unspecified: Secondary | ICD-10-CM | POA: Diagnosis present

## 2023-10-12 DIAGNOSIS — F39 Unspecified mood [affective] disorder: Secondary | ICD-10-CM | POA: Diagnosis present

## 2023-10-12 DIAGNOSIS — I509 Heart failure, unspecified: Secondary | ICD-10-CM | POA: Diagnosis present

## 2023-10-12 DIAGNOSIS — Z6841 Body Mass Index (BMI) 40.0 and over, adult: Secondary | ICD-10-CM | POA: Diagnosis not present

## 2023-10-12 DIAGNOSIS — R296 Repeated falls: Secondary | ICD-10-CM | POA: Diagnosis present

## 2023-10-12 DIAGNOSIS — Z7901 Long term (current) use of anticoagulants: Secondary | ICD-10-CM | POA: Diagnosis not present

## 2023-10-12 DIAGNOSIS — D649 Anemia, unspecified: Secondary | ICD-10-CM | POA: Diagnosis not present

## 2023-10-12 DIAGNOSIS — D509 Iron deficiency anemia, unspecified: Secondary | ICD-10-CM | POA: Diagnosis present

## 2023-10-12 DIAGNOSIS — I4819 Other persistent atrial fibrillation: Secondary | ICD-10-CM | POA: Diagnosis present

## 2023-10-12 DIAGNOSIS — I890 Lymphedema, not elsewhere classified: Secondary | ICD-10-CM | POA: Diagnosis present

## 2023-10-12 LAB — IRON AND TIBC
Iron: 18 ug/dL — ABNORMAL LOW (ref 28–170)
Saturation Ratios: 5 % — ABNORMAL LOW (ref 10.4–31.8)
TIBC: 378 ug/dL (ref 250–450)
UIBC: 360 ug/dL

## 2023-10-12 LAB — BASIC METABOLIC PANEL WITH GFR
Anion gap: 15 (ref 5–15)
BUN: 9 mg/dL (ref 8–23)
CO2: 25 mmol/L (ref 22–32)
Calcium: 8.6 mg/dL — ABNORMAL LOW (ref 8.9–10.3)
Chloride: 104 mmol/L (ref 98–111)
Creatinine, Ser: 1.03 mg/dL — ABNORMAL HIGH (ref 0.44–1.00)
GFR, Estimated: 55 mL/min — ABNORMAL LOW (ref >60)
Glucose, Bld: 115 mg/dL — ABNORMAL HIGH (ref 70–99)
Potassium: 3.4 mmol/L — ABNORMAL LOW (ref 3.5–5.1)
Sodium: 144 mmol/L (ref 135–145)

## 2023-10-12 LAB — FERRITIN: Ferritin: 30 ng/mL (ref 11–307)

## 2023-10-12 LAB — CBC
HCT: 28.2 % — ABNORMAL LOW (ref 36.0–46.0)
Hemoglobin: 8.2 g/dL — ABNORMAL LOW (ref 12.0–15.0)
MCH: 24.7 pg — ABNORMAL LOW (ref 26.0–34.0)
MCHC: 29.1 g/dL — ABNORMAL LOW (ref 30.0–36.0)
MCV: 84.9 fL (ref 80.0–100.0)
Platelets: 251 10*3/uL (ref 150–400)
RBC: 3.32 MIL/uL — ABNORMAL LOW (ref 3.87–5.11)
RDW: 16.8 % — ABNORMAL HIGH (ref 11.5–15.5)
WBC: 7.4 10*3/uL (ref 4.0–10.5)
nRBC: 0 % (ref 0.0–0.2)

## 2023-10-12 LAB — MAGNESIUM: Magnesium: 2 mg/dL (ref 1.7–2.4)

## 2023-10-12 LAB — TSH: TSH: 7.804 u[IU]/mL — ABNORMAL HIGH (ref 0.350–4.500)

## 2023-10-12 LAB — VITAMIN B12: Vitamin B-12: 2280 pg/mL — ABNORMAL HIGH (ref 180–914)

## 2023-10-12 LAB — TRANSFERRIN: Transferrin: 269 mg/dL (ref 192–382)

## 2023-10-12 LAB — PHOSPHORUS: Phosphorus: 3.4 mg/dL (ref 2.5–4.6)

## 2023-10-12 MED ORDER — IRON SUCROSE 200 MG IVPB - SIMPLE MED
200.0000 mg | Status: DC
  Administered 2023-10-12: 200 mg via INTRAVENOUS
  Filled 2023-10-12: qty 200
  Filled 2023-10-12: qty 110

## 2023-10-12 MED ORDER — METOPROLOL TARTRATE 25 MG PO TABS
25.0000 mg | ORAL_TABLET | Freq: Two times a day (BID) | ORAL | Status: DC
  Administered 2023-10-12 – 2023-10-15 (×7): 25 mg via ORAL
  Filled 2023-10-12 (×7): qty 1

## 2023-10-12 MED ORDER — FUROSEMIDE 10 MG/ML IJ SOLN
40.0000 mg | Freq: Three times a day (TID) | INTRAMUSCULAR | Status: DC
  Administered 2023-10-12 – 2023-10-14 (×6): 40 mg via INTRAVENOUS
  Filled 2023-10-12 (×6): qty 4

## 2023-10-12 NOTE — Progress Notes (Signed)
Pt has returned from CT scan

## 2023-10-12 NOTE — Progress Notes (Signed)
Pt going down for CT scan at this time

## 2023-10-12 NOTE — Plan of Care (Signed)

## 2023-10-12 NOTE — Evaluation (Signed)
 Physical Therapy Evaluation Patient Details Name: Angie Figueroa MRN: [id number] DOB: 1945-10-12 Today's Date: 10/12/2023  History of Present Illness  Angie Figueroa is a 78 y.o. female who presented 10/11/23 as a transfer from [facility] ED for heart failure exacerbation. Pt presented with progressive LE edema with weeping fluid, DOE, and activity intolerance. Pt noted a weight gain of ~20lbs. Chest x-ray with vascular congestion and interstitial edema. PMH of heart failure with preserved ejection fraction, persistent A-fib on anticoagulation, ablation, recent DCCV last month, hypertension, CKD 3A, morbid obesity, hypothyroidism, and chronic pain.   Clinical Impression  Pt admitted with above diagnosis. PTA, pt was modI for functional mobility using DME. She recently transitioned from [facility] to RW d/t her increased falls and unsteadiness. She has been receiving assistance from her friend Leola Raisin with ADLs since her R knee wound developed. Pt relies on Leola Raisin and Josiah Nigh for IADLs. She lives with her spouse and friend in a three story house with 3 STE and BHR. She is able to reside on the main level. Pt currently with functional limitations due to the deficits listed below (see PT Problem List). She required CGA for functional mobility. Pt transferred bed<>BSC and engaged in lateral side stepping along EOB. Unable to advance gait d/t fatigue. Pt will benefit from acute skilled PT to increase their independence and safety with mobility to allow discharge home with HHPT.     If plan is discharge home, recommend the following: A little help with walking and/or transfers;A little help with bathing/dressing/bathroom;Assistance with cooking/housework;Assist for transportation;Help with stairs or ramp for entrance   Can travel by private vehicle        Equipment Recommendations None recommended by PT (Pt already has DME)  Recommendations for Other Services       Functional Status Assessment Patient has had a  recent decline in their functional status and demonstrates the ability to make significant improvements in function in a reasonable and predictable amount of time.     Precautions / Restrictions Precautions Precautions: Fall Recall of Precautions/Restrictions: Intact Restrictions Weight Bearing Restrictions Per Provider Order: No      Mobility  Bed Mobility Overal bed mobility: Needs Assistance Bed Mobility: Supine to Sit, Sit to Supine     Supine to sit: HOB elevated, Contact guard Sit to supine: HOB elevated, Supervision   General bed mobility comments: Pt sat up on L side of bed with HOB elevated ~50deg. She brought BLE off EOB and pulled on PT to elevate trunk. Pt scooted to EOB with BUE support. Pt returned to be with HOB elevated ~15deg.    Transfers Overall transfer level: Needs assistance Equipment used: Rolling walker (2 wheels) Transfers: Sit to/from Stand, Bed to chair/wheelchair/BSC Sit to Stand: Contact guard assist   Step pivot transfers: Contact guard assist       General transfer comment: Pt stood from lowest bed height and recliner chair. She demonstrated preference to maintain BUE support on RW while powering up. Cued pt on proper hand placement for improved safety awareness. She pivoted to [facility] on her left. Good eccentric control with sitting.    Ambulation/Gait Ambulation/Gait assistance: Contact guard assist Gait Distance (Feet): 5 Feet Assistive device: Rolling walker (2 wheels) Gait Pattern/deviations: Step-through pattern, Decreased stride length, Decreased step length - right, Decreased step length - left       General Gait Details: Pt transferred from bed<>BSC and engaged in side stepping along EOB to the L. She took short slow steps and manuevered  RW well with adequate foot clearence and upright posture. Distance limited by pt d/t fatiuge, she reports not sleeping well the past couple of days.  Stairs            Wheelchair Mobility      Tilt Bed    Modified Rankin (Stroke Patients Only)       Balance Overall balance assessment: Needs assistance Sitting-balance support: No upper extremity supported Sitting balance-Leahy Scale: Good Sitting balance - Comments: Pt sat EOB and on BSC with supervision. She addressed pericare without assist.   Standing balance support: No upper extremity supported, Bilateral upper extremity supported, During functional activity, Reliant on assistive device for balance Standing balance-Leahy Scale: Fair Standing balance comment: Pt maintained static stance without UE support and was able to dof/don gown. She is dependent on RW during transfers/gait.                             Pertinent Vitals/Pain Pain Assessment Pain Assessment: 0-10 Pain Score: 5  Pain Location: R Knee Pain Descriptors / Indicators: Discomfort, Aching, Tender Pain Intervention(s): Limited activity within patient's tolerance, Monitored during session    Home Living Family/patient expects to be discharged to:: Private residence Living Arrangements: Spouse/significant other;Non-relatives/Friends Available Help at Discharge: Family;Available 24 hours/day Type of Home: House Home Access: Stairs to enter Entrance Stairs-Rails: Right;Left;Can reach both Entrance Stairs-Number of Steps: 3   Home Layout: Multi-level;Able to live on main level with bedroom/bathroom;Laundry or work area in [employer] Equipment: Agricultural consultant (2 wheels);Shower seat;Cane - single point;Wheelchair - manual;Grab bars - toilet;Grab bars - tub/shower      Prior Function Prior Level of Function : Independent/Modified Independent;Driving;History of Falls (last six months)             Mobility Comments: Ambulates using SPC in the house and RW in the community. Pt reports 9 falls in the last 93mo, two in the last week. Since the increase in falls she has been using the RW primarily. ADLs Comments: Leola Raisin has began assisting  her with bathing, dressing, toileting prn since her R knee wound developed. She is able to drive, but doesn't feel comfortable doing so. Relies on Janice/Jim for IADls. Pt volunteers as a part of the [employer]. Really enjoys going to car shows.     Extremity/Trunk Assessment   Upper Extremity Assessment Upper Extremity Assessment: Defer to OT evaluation    Lower Extremity Assessment Lower Extremity Assessment: Generalized weakness    Cervical / Trunk Assessment Cervical / Trunk Assessment: Other exceptions Cervical / Trunk Exceptions: Body Habitus  Communication   Communication Communication: No apparent difficulties    Cognition Arousal: Alert Behavior During Therapy: WFL for tasks assessed/performed   PT - Cognitive impairments: No apparent impairments                       PT - Cognition Comments: Pt A,Ox4 Following commands: Intact       Cueing Cueing Techniques: Verbal cues     General Comments General comments (skin integrity, edema, etc.): Pt's R knee wound was wrapped in gauze. Following the transfer the dressing slide off her knee, but xeroform was intact. Once she returned to supine in bed xeroform came off.    Exercises     Assessment/Plan    PT Assessment Patient needs continued PT services  PT Problem List Decreased strength;Decreased activity tolerance;Decreased balance;Decreased mobility;Decreased safety awareness;Pain  PT Treatment Interventions DME instruction;Gait training;Stair training;Functional mobility training;Therapeutic activities;Therapeutic exercise;Balance training;Patient/family education    PT Goals (Current goals can be found in the Care Plan section)  Acute Rehab PT Goals Patient Stated Goal: Regain my strength and independence so I can go back to enjoying car shows. PT Goal Formulation: With patient/family Time For Goal Achievement: 10/26/23 Potential to Achieve Goals: Good    Frequency Min 2X/week      Co-evaluation               AM-PAC PT "6 Clicks" Mobility  Outcome Measure Help needed turning from your back to your side while in a flat bed without using bedrails?: A Little Help needed moving from lying on your back to sitting on the side of a flat bed without using bedrails?: A Little Help needed moving to and from a bed to a chair (including a wheelchair)?: A Little Help needed standing up from a chair using your arms (e.g., wheelchair or bedside chair)?: A Little Help needed to walk in hospital room?: A Lot Help needed climbing 3-5 steps with a railing? : A Lot 6 Click Score: 16    End of Session Equipment Utilized During Treatment: Gait belt Activity Tolerance: Patient tolerated treatment well;Patient limited by fatigue Patient left: in bed;with call bell/phone within reach;with family/visitor present Nurse Communication: Mobility status;Other (comment) (Need for new purewick and R knee wound dressing to be replaced.) PT Visit Diagnosis: History of falling (Z91.81);Repeated falls (R29.6);Unsteadiness on feet (R26.81);Muscle weakness (generalized) (M62.81);Difficulty in walking, not elsewhere classified (R26.2);Pain Pain - Right/Left: Right Pain - part of body: Knee    Time: 4098-1191 PT Time Calculation (min) (ACUTE ONLY): 32 min   Charges:   PT Evaluation $PT Eval Moderate Complexity: 1 Mod   PT General Charges $$ ACUTE PT VISIT: 1 Visit         Glenford Lanes, PT, DPT Acute Rehabilitation Services Office: [phone] Secure Chat Preferred  Riva Chester 10/12/2023, 2:16 PM

## 2023-10-12 NOTE — Plan of Care (Signed)
   Problem: Activity: Goal: Risk for activity intolerance will decrease Outcome: Progressing   Problem: Coping: Goal: Level of anxiety will decrease Outcome: Progressing

## 2023-10-12 NOTE — Care Management Obs Status (Signed)
 MEDICARE OBSERVATION STATUS NOTIFICATION   Patient Details  Name: Quintasia Rokes MRN: [id number] Date of Birth: Sep 24, 1945   Medicare Observation Status Notification Given:  Yes    Omie Bickers, RN 10/12/2023, 2:42 PM

## 2023-10-12 NOTE — H&P (Addendum)
 History and Physical    Angie Figueroa ZOX:[id number] DOB: [birth year] DOA: 10/11/2023  PCP: Liza Riggers., MD   Patient coming from: Transfer from [facility] ED   Chief Complaint:  Chief Complaint  Patient presents with   Shortness of Breath   Leg Swelling    HPI:  Angie Figueroa is a 78 y.o. female with hx of heart failure with preserved ejection fraction, persistent A-fib on anticoagulation, history of ablation, recent DCCV last month, hypertension, CKD 3A, morbid obesity, Hypothyroidism, chronic pain, who was transferred from [facility] ED for heart failure exacerbation.  Reports that over the past 3 weeks she has developed progressive lower extremity edema with weeping fluid, dyspnea on exertion and activity intolerance.  No associated chest pain.  With her worsening symptoms she has had more debility and multiple falls within the past week including head injury while on anticoagulation.  Denies any other injuries although she has a chronic wound over the right knee from a prior fall which is improving with outpatient wound care.  She has been tracking her weights and per home scale weights increasing to 243 ->  263 lbs but she has not changed her diuretic dosing.  Reports taking Lasix 3 tablets daily but uncertain of the dose.  Does not have any instructions for prn dosing for weight gain.   Review of Systems:  ROS complete and negative except as marked above   Allergies  Allergen Reactions   Pregabalin Other (See Comments)    Weight gain    Prior to Admission medications   Medication Sig Start Date End Date Taking? Authorizing Provider  amiodarone (PACERONE) 200 MG tablet Take 200 mg by mouth daily. Twice daily   Yes [provider]  apixaban (ELIQUIS) 5 MG TABS tablet Take 5 mg by mouth 2 (two) times daily.   Yes [provider]  Cholecalciferol 25 MCG (1000 UT) capsule Take 1,000 Units by mouth daily.   Yes [provider]  diltiazem (CARDIZEM SR)  90 MG 12 hr capsule Take 90 mg by mouth 2 (two) times daily.   Yes [provider]  furosemide (LASIX) 20 MG tablet Take 40 mg by mouth daily.   Yes [provider]  hydrALAZINE (APRESOLINE) 25 MG tablet Take 25 mg by mouth 3 (three) times daily.   Yes [provider]  HYDROcodone-acetaminophen (NORCO/VICODIN) 5-325 MG tablet Take 1 tablet by mouth every 6 (six) hours as needed for moderate pain (pain score 4-6).   Yes [provider]  lamoTRIgine (LAMICTAL) 25 MG tablet Take 25 mg by mouth daily. Two tabs (50mg ) twice daily   Yes [provider]  lidocaine (XYLOCAINE) 2 % solution Use as directed 15 mLs in the mouth or throat as needed for mouth pain.   Yes [provider]  LORazepam (ATIVAN) 0.5 MG tablet Take 0.5 mg by mouth every 8 (eight) hours.   Yes [provider]  omeprazole-sodium bicarbonate (ZEGERID) 40-1100 MG capsule Take 1 capsule by mouth daily before breakfast.   Yes [provider]  ondansetron (ZOFRAN-ODT) 4 MG disintegrating tablet Take 4 mg by mouth every 8 (eight) hours as needed for nausea or vomiting.   Yes [provider]  spironolactone (ALDACTONE) 25 MG tablet Take 25 mg by mouth daily.   Yes [provider]  tiZANidine (ZANAFLEX) 4 MG tablet Take 4 mg by mouth every 6 (six) hours as needed for muscle spasms.   Yes [provider]  triamcinolone cream (KENALOG)  0.1 % Apply 1 Application topically 2 (two) times daily.   Yes [provider]  doxycycline  (VIBRAMYCIN ) 100 MG capsule Take 1 capsule (100 mg total) by mouth 2 (two) times daily. 10/05/21   Zackowski, Scott, MD    Past Medical History:  Diagnosis Date   Atrial fibrillation [facility])    CHF (congestive heart failure) (HCC)    Dyspnea    Hypertension     History reviewed. No pertinent surgical history.   reports that she has never smoked. She has never used smokeless tobacco. She reports that she does not  currently use alcohol. She reports that she does not use drugs.  History reviewed. No pertinent family history.   Physical Exam: Vitals:   10/11/23 1339 10/11/23 1339 10/11/23 1933 10/11/23 2220  BP:  (!) 151/84 (!) 149/51 139/62  Pulse:  89 96 95  Resp:  20 (!) 24 18  Temp: 97.7 F (36.5 C) 97.7 F (36.5 C) 98.9 F (37.2 C) 97.8 F (36.6 C)  TempSrc: Oral Oral Oral Oral  SpO2:  95% 93% 96%  Weight:    116.3 kg  Height:        Gen: Awake, alert, elderly, frail CV: Irregular, normal S1, S2, no murmurs  Resp: Normal WOB, rales in the bases Abd: Obese, normoactive, nontender MSK: Asymmetric RLE greater than LLE.  RLE has woody tough edema and venous stasis changes.  LLE does not have these changes and has 3+ pitting edema which tapers to the knee. Skin: There is a healing wound over the right knee with granulation tissue, no discharge, dressing clean and dry.  No other rashes or lesions to exposed skin  Neuro: Alert and interactive  Psych: euthymic, appropriate    Data review:   Labs reviewed, notable for:   K3.1 BNP 1179 High-sensitivity Trop 18 -> 18 Hemoglobin 9, normocytic  Micro:  Results for orders placed or performed during the hospital encounter of 10/11/23  Resp panel by RT-PCR (RSV, Flu A&B, Covid) Anterior Nasal Swab     Status: None   Collection Time: 10/11/23  6:41 PM   Specimen: Anterior Nasal Swab  Result Value Ref Range Status   SARS Coronavirus 2 by RT PCR NEGATIVE NEGATIVE Final    Comment: (NOTE) SARS-CoV-2 target nucleic acids are NOT DETECTED.  The SARS-CoV-2 RNA is generally detectable in upper respiratory specimens during the acute phase of infection. The lowest concentration of SARS-CoV-2 viral copies this assay can detect is 138 copies/mL. A negative result does not preclude SARS-Cov-2 infection and should not be used as the sole basis for treatment or other patient management decisions. A negative result may occur with  improper specimen  collection/handling, submission of specimen other than nasopharyngeal swab, presence of viral mutation(s) within the areas targeted by this assay, and inadequate number of viral copies(<138 copies/mL). A negative result must be combined with clinical observations, patient history, and epidemiological information. The expected result is Negative.  Fact Sheet for Patients:  BloggerCourse.com  Fact Sheet for Healthcare Providers:  SeriousBroker.it  This test is no t yet approved or cleared by the [place]  FDA and  has been authorized for detection and/or diagnosis of SARS-CoV-2 by FDA under an Emergency Use Authorization (EUA). This EUA will remain  in effect (meaning this test can be used) for the duration of the COVID-19 declaration under Section 564(b)(1) of the Act, 21 U.S.C.section 360bbb-3(b)(1), unless the authorization is terminated  or revoked sooner.       Influenza A  by PCR NEGATIVE NEGATIVE Final   Influenza B by PCR NEGATIVE NEGATIVE Final    Comment: (NOTE) The Xpert Xpress SARS-CoV-2/FLU/RSV plus assay is intended as an aid in the diagnosis of influenza from Nasopharyngeal swab specimens and should not be used as a sole basis for treatment. Nasal washings and aspirates are unacceptable for Xpert Xpress SARS-CoV-2/FLU/RSV testing.  Fact Sheet for Patients: BloggerCourse.com  Fact Sheet for Healthcare Providers: SeriousBroker.it  This test is not yet approved or cleared by the [place]  FDA and has been authorized for detection and/or diagnosis of SARS-CoV-2 by FDA under an Emergency Use Authorization (EUA). This EUA will remain in effect (meaning this test can be used) for the duration of the COVID-19 declaration under Section 564(b)(1) of the Act, 21 U.S.C. section 360bbb-3(b)(1), unless the authorization is terminated or revoked.     Resp Syncytial  Virus by PCR NEGATIVE NEGATIVE Final    Comment: (NOTE) Fact Sheet for Patients: BloggerCourse.com  Fact Sheet for Healthcare Providers: SeriousBroker.it  This test is not yet approved or cleared by the [place]  FDA and has been authorized for detection and/or diagnosis of SARS-CoV-2 by FDA under an Emergency Use Authorization (EUA). This EUA will remain in effect (meaning this test can be used) for the duration of the COVID-19 declaration under Section 564(b)(1) of the Act, 21 U.S.C. section 360bbb-3(b)(1), unless the authorization is terminated or revoked.  Performed at [facility], [address] [place], [place] [address]     Imaging reviewed:  US  Venous Img Lower Right (DVT Study) Result Date: 10/11/2023 CLINICAL DATA:  Right sided leg pain and swelling EXAM: Right LOWER EXTREMITY VENOUS DOPPLER ULTRASOUND TECHNIQUE: Gray-scale sonography with graded compression, as well as color Doppler and duplex ultrasound were performed to evaluate the lower extremity deep venous systems from the level of the common femoral vein and including the common femoral, femoral, profunda femoral, popliteal and calf veins including the posterior tibial, peroneal and gastrocnemius veins when visible. The superficial great saphenous vein was also interrogated. Spectral Doppler was utilized to evaluate flow at rest and with distal augmentation maneuvers in the common femoral, femoral and popliteal veins. COMPARISON:  Ultrasound 10/05/2021 FINDINGS: Contralateral Common Femoral Vein: Respiratory phasicity is normal and symmetric with the symptomatic side. No evidence of thrombus. Normal compressibility. Common Femoral Vein: No evidence of thrombus. Normal compressibility, respiratory phasicity and response to augmentation. Saphenofemoral Junction: No evidence of thrombus. Normal compressibility and flow on color Doppler imaging. Profunda Femoral  Vein: No evidence of thrombus. Normal compressibility and flow on color Doppler imaging. Femoral Vein: No evidence of thrombus. Normal compressibility, respiratory phasicity and response to augmentation. Popliteal Vein: No evidence of thrombus. Normal compressibility, respiratory phasicity and response to augmentation. Calf Veins: No evidence of thrombus. Normal compressibility and flow on color Doppler imaging. Limited visualization of the peroneal vein. Superficial Great Saphenous Vein: No evidence of thrombus. Normal compressibility. Venous Reflux:  None. Other Findings:  Scattered soft tissue edema. IMPRESSION: No evidence of right lower extremity DVT. Electronically Signed   By: Adrianna Horde M.D.   On: 10/11/2023 16:06   DG Chest 2 View Result Date: 10/11/2023 CLINICAL DATA:  Shortness of breath.  Leg pain for 3 days EXAM: CHEST - 2 VIEW COMPARISON:  None Available. FINDINGS: Under penetrated radiographs. Enlarged cardiopericardial silhouette with some vascular congestion and possible interstitial edema. No pneumothorax or effusion. No consolidation. Overlapping cardiac leads. Films are under penetrated. Degenerative changes along the spine. Calcified aorta. Surgical clips  in the upper abdomen. IMPRESSION: Enlarged heart with some central vascular congestion. Interstitial prominence is also seen. Acute versus chronic such as edema. Please correlate with any prior or follow up. Under penetrated radiographs. Electronically Signed   By: Adrianna Horde M.D.   On: 10/11/2023 16:04    EKG:  Personally reviewed, A-fib rate controlled at 94, QTc 496, PRWP, no acute ischemic changes  ED Course:  Treated with Lasix 40 mg IV, 40 mEq potassium, transferred for further care.   Assessment/Plan:  78 y.o. female with hx of heart failure with preserved ejection fraction, persistent A-fib on anticoagulation, history of ablation, recent DCCV last month, hypertension, CKD 3A, morbid obesity, who was transferred from  [facility] ED for heart failure exacerbation.   Acute on chronic HFpEF Last TTE 10/' 24 was a limited study unable to report LVEF, mild LVH and LA dilation noted.  Has developed progressive weight gain, LE edema, DOE over 3 weeks.  Reports dry weight 243 LB -> 263 lb on home scale / 256 lb on admission. On RA.  BNP 1179.  Chest x-ray with vascular congestion and interstitial edema.  Etiology of her heart failure exacerbation possibly in the setting of A-fib, recent cardioversion, inadequate diuretic dosing. - Status post Lasix 40mg  IV in the ED, redose 40 mg IV x 1 and tentatively scheduled for 40 mg IV daily - Repeat TTE - Telemetry, strict I's and O's, daily weights, heart failure navigator/TOC consult. 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  Venous duplex at outside ED negative for DVT  Normocytic anemia - Check iron panel, B12  Hypokalemia Repleted  Chronic medical problems: Persistent A-fib: History of ablation, DCCV 4/24.  Continue home Eliquis, amiodarone 200 mg twice daily Hypertension: Continue home hydralazine 25 mg 3 times daily, spironolactone 25 mg daily CKD 3A: Baseline creatinine~1 Morbid obesity: Would benefit from weight loss outpatient Hypothyroidism: Continue home levothyroxine, check TSH Chronic pain: Continue home Percocet prn, tizanidine prn ?  Mood disorder: Continue home lamotrigine  Body mass index is 44.01 kg/m.    DVT prophylaxis:  Eliquis Code Status:  Full Code Diet:  Diet Orders (From admission, onward)     Start     Ordered   10/11/23 2316  Diet 2 gram sodium Room service appropriate? Yes; Fluid consistency: Thin  Diet effective now       Question Answer Comment  Room service appropriate? Yes   Fluid consistency: Thin      10/11/23 2317           Family Communication: Yes discussed with family at bedside Consults: None Admission status:   Observation, Telemetry  bed  Severity of Illness: The appropriate patient status for this patient is OBSERVATION. Observation status is judged to be reasonable and necessary in order to provide the required intensity of service to ensure the patient's safety. The patient's presenting symptoms, physical exam findings, and initial radiographic and laboratory data in the context of their medical condition is felt to place them at decreased risk for further clinical deterioration. Furthermore, it is anticipated that the patient will be medically stable for discharge from the hospital within 2 midnights of admission.    Arnulfo Larch, MD [facility]  How to contact the [facility] Attending or Consulting provider 7A - 7P or covering provider during after hours 7P -7A, for this patient.  Check the care team in [facility] and look for a) attending/consulting [facility] provider  listed and b) the [facility] team listed Log into [email or web address] and use [facility]'s universal password to access. If you do not have the password, please contact the hospital operator. Locate the [facility] provider you are looking for under [facility] and page to a number that you can be directly reached. If you still have difficulty reaching the provider, please page the [facility] (Director on Call) for the Hospitalists listed on amion for assistance.  10/12/2023, 2:04 AM

## 2023-10-12 NOTE — Consult Note (Addendum)
 WOC Nurse Consult Note: patient is followed at [facility] for R knee wound; last seen 5/5 and ordered Xeroform every other day  Reason for Consult: R knee wound  Wound type: full thickness traumatic per notes  Pressure Injury POA: NA  Measurement: see nursing flowsheet  Wound bed: granulation tissue per MD note  Drainage (amount, consistency, odor) no purulent drainage per MD note  Periwound: see nursing flowsheet  Dressing procedure/placement/frequency: Cleanse R knee wound with NS, apply Xeroform gauze Timm Foot [phone]) to wound bed every other day, secure with Kerlix roll gauze or silicone foam.  Patient has a tubigrip she utilizes at home, if she has this can wear or can place an Ace wrap to secure dressing if needed.  MOISTEN XEROFORM WITH NS IF STUCK TO WOUND BED FOR ATRAUMATIC REMOVAL.    Patient should continue to follow with [facility] at discharge for ongoing management of this wound.   POC discussed with bedside nurse. WOC team will not follow. Re-consult if further needs arise.   Thank you,    Ronni Colace MSN, RN-BC, [employer] [phone]

## 2023-10-12 NOTE — Progress Notes (Signed)
 PROGRESS NOTE    Angie Figueroa  UXL:[id number] DOB: Feb 02, 1946 DOA: 10/11/2023 PCP: Liza Riggers., MD   78/F w chronic diastolic CHF, persistent A-fib on anticoagulation, history of ablation, recent DCCV last month, hypertension, CKD 3A, morbid obesity, Hypothyroidism, chronic pain, presented to ED with worsening dyspnea on exertion, edema, frequent falls, she also has a wound over her right knee> getting wound care.  Also reports 20 pound weight gain, compliant with diuretics. - In the ED, hypertensive, noted to be in rate controlled A-fib, labs noted BNP 1179, troponin 18, hemoglobin 9.3, creatinine 0.9   Subjective: -Ongoing weakness, falls, dyspnea and edema worse in the right leg  Assessment and Plan:  Acute on chronic HFpEF Last TTE 10/' 24 was a limited study unable to report LVEF, mild LVH and LA dilation noted -Continue IV Lasix today, will increase dose, add Aldactone -Follow-up repeat echo - Poor candidate for SGLT2i - Increase activity, PT OT eval    Iron deficiency anemia - add IV iron today, she denies any overt blood loss   Hypokalemia Repleted   Persistent A-fib: History of ablation, DCCV 4/24.  Continue home Eliquis, amiodarone 200 mg twice daily -Heart rate suboptimal add low-dose metoprolol  Hypertension:  -Continue hydralazine, spironolactone 25 mg daily  CKD 3A: Baseline creatinine~1  Morbid obesity: Would benefit from weight loss outpatient  Hypothyroidism: - Continue levothyroxine, TSH mildly elevated however in the setting of acute illness we will recommend repeat in few weeks  Chronic pain: Continue home Percocet prn, tizanidine prn  ?  Mood disorder: Continue home lamotrigine   DVT prophylaxis: Eliquis Code Status: Full code Family Communication: Spouse at bedside. Disposition Plan: Home likely 2 to 3 days  Consultants:    Procedures:   Antimicrobials:    Objective: Vitals:   10/12/23 0327 10/12/23 0500 10/12/23 0800 10/12/23  0806  BP: (!) 146/77  (!) 173/86 (!) 151/71  Pulse: 96  96 (!) 103  Resp: 20  (!) 26 17  Temp: 98.3 F (36.8 C)  98.6 F (37 C)   TempSrc: Oral  Oral   SpO2: 93%  97% 91%  Weight:  114.7 kg    Height:        Intake/Output Summary (Last 24 hours) at 10/12/2023 1023 Last data filed at 10/12/2023 1005 Gross per 24 hour  Intake 480 ml  Output 4000 ml  Net -3520 ml   Filed Weights   10/11/23 1337 10/11/23 2220 10/12/23 0500  Weight: 119.7 kg 116.3 kg 114.7 kg    Examination:  General exam: Obese chronically ill female laying in bed, AAO x 3 HEENT: Positive JVD CVS: S1-S2, irregular rhythm Lungs: Few basilar Rales, decreased breath sounds to bases Abdomen: Soft, nontender, bowel sounds present EXTR midis: 1-2+ edema, wound over right knee with pink granulation tissue and minimal surrounding purulence Psychiatry:  Mood & affect appropriate.     Data Reviewed:   CBC: Recent Labs  Lab 10/11/23 1344 10/12/23 0304  WBC 8.6 7.4  HGB 9.3* 8.2*  HCT 32.2* 28.2*  MCV 85.6 84.9  PLT 272 251   Basic Metabolic Panel: Recent Labs  Lab 10/11/23 1438 10/12/23 0304  NA 143 144  K 3.1* 3.4*  CL 103 104  CO2 25 25  GLUCOSE 91 115*  BUN 11 9  CREATININE 0.98 1.03*  CALCIUM 9.2 8.6*  MG  --  2.0  PHOS  --  3.4   GFR: Estimated Creatinine Clearance: 54.1 mL/min (A) (by C-G formula based on  SCr of 1.03 mg/dL (H)). Liver Function Tests: No results for input(s): "AST", "ALT", "ALKPHOS", "BILITOT", "PROT", "ALBUMIN" in the last 168 hours. No results for input(s): "LIPASE", "AMYLASE" in the last 168 hours. No results for input(s): "AMMONIA" in the last 168 hours. Coagulation Profile: No results for input(s): "INR", "PROTIME" in the last 168 hours. Cardiac Enzymes: No results for input(s): "CKTOTAL", "CKMB", "CKMBINDEX", "TROPONINI" in the last 168 hours. BNP (last 3 results) Recent Labs    10/11/23 1344  PROBNP 1,179.0*   HbA1C: No results for input(s): "HGBA1C" in  the last 72 hours. CBG: No results for input(s): "GLUCAP" in the last 168 hours. Lipid Profile: No results for input(s): "CHOL", "HDL", "LDLCALC", "TRIG", "CHOLHDL", "LDLDIRECT" in the last 72 hours. Thyroid Function Tests: Recent Labs    10/12/23 0304  TSH 7.804*   Anemia Panel: Recent Labs    10/12/23 0304  VITAMINB12 2,280*  FERRITIN 30  TIBC 378  IRON 18*   Urine analysis: No results found for: "COLORURINE", "APPEARANCEUR", "LABSPEC", "PHURINE", "GLUCOSEU", "HGBUR", "BILIRUBINUR", "KETONESUR", "PROTEINUR", "UROBILINOGEN", "NITRITE", "LEUKOCYTESUR" Sepsis Labs: @LABRCNTIP (procalcitonin:4,lacticidven:4)  ) Recent Results (from the past 240 hours)  Resp panel by RT-PCR (RSV, Flu A&B, Covid) Anterior Nasal Swab     Status: None   Collection Time: 10/11/23  6:41 PM   Specimen: Anterior Nasal Swab  Result Value Ref Range Status   SARS Coronavirus 2 by RT PCR NEGATIVE NEGATIVE Final    Comment: (NOTE) SARS-CoV-2 target nucleic acids are NOT DETECTED.  The SARS-CoV-2 RNA is generally detectable in upper respiratory specimens during the acute phase of infection. The lowest concentration of SARS-CoV-2 viral copies this assay can detect is 138 copies/mL. A negative result does not preclude SARS-Cov-2 infection and should not be used as the sole basis for treatment or other patient management decisions. A negative result may occur with  improper specimen collection/handling, submission of specimen other than nasopharyngeal swab, presence of viral mutation(s) within the areas targeted by this assay, and inadequate number of viral copies(<138 copies/mL). A negative result must be combined with clinical observations, patient history, and epidemiological information. The expected result is Negative.  Fact Sheet for Patients:  BloggerCourse.com  Fact Sheet for Healthcare Providers:  SeriousBroker.it  This test is no t yet  approved or cleared by the [place]  FDA and  has been authorized for detection and/or diagnosis of SARS-CoV-2 by FDA under an Emergency Use Authorization (EUA). This EUA will remain  in effect (meaning this test can be used) for the duration of the COVID-19 declaration under Section 564(b)(1) of the Act, 21 U.S.C.section 360bbb-3(b)(1), unless the authorization is terminated  or revoked sooner.       Influenza A by PCR NEGATIVE NEGATIVE Final   Influenza B by PCR NEGATIVE NEGATIVE Final    Comment: (NOTE) The Xpert Xpress SARS-CoV-2/FLU/RSV plus assay is intended as an aid in the diagnosis of influenza from Nasopharyngeal swab specimens and should not be used as a sole basis for treatment. Nasal washings and aspirates are unacceptable for Xpert Xpress SARS-CoV-2/FLU/RSV testing.  Fact Sheet for Patients: BloggerCourse.com  Fact Sheet for Healthcare Providers: SeriousBroker.it  This test is not yet approved or cleared by the [place]  FDA and has been authorized for detection and/or diagnosis of SARS-CoV-2 by FDA under an Emergency Use Authorization (EUA). This EUA will remain in effect (meaning this test can be used) for the duration of the COVID-19 declaration under Section 564(b)(1) of the Act, 21 U.S.C. section 360bbb-3(b)(1), unless the authorization  is terminated or revoked.     Resp Syncytial Virus by PCR NEGATIVE NEGATIVE Final    Comment: (NOTE) Fact Sheet for Patients: BloggerCourse.com  Fact Sheet for Healthcare Providers: SeriousBroker.it  This test is not yet approved or cleared by the [place]  FDA and has been authorized for detection and/or diagnosis of SARS-CoV-2 by FDA under an Emergency Use Authorization (EUA). This EUA will remain in effect (meaning this test can be used) for the duration of the COVID-19 declaration under Section 564(b)(1) of  the Act, 21 U.S.C. section 360bbb-3(b)(1), unless the authorization is terminated or revoked.  Performed at [facility], [address] [place], [place] [address]      Radiology Studies: CT HEAD WO CONTRAST ( ) Result Date: 10/12/2023 CLINICAL DATA:  Fall on anticoagulation.  Rule out ICH EXAM: CT HEAD WITHOUT CONTRAST TECHNIQUE: Contiguous axial images were obtained from the base of the skull through the vertex without intravenous contrast. RADIATION DOSE REDUCTION: This exam was performed according to the departmental dose-optimization program which includes automated exposure control, adjustment of the mA and/or kV according to patient size and/or use of iterative reconstruction technique. COMPARISON:  None Available. FINDINGS: Brain: No evidence of acute infarction, hemorrhage, hydrocephalus, extra-axial collection or mass lesion/mass effect. Low-density beneath the left putamen is sizable and most likely a dilated perivascular space. Mild white matter low-density for age. Mild brain atrophy. 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 Vascular: No hyperdense vessel or unexpected calcification. Skull: Normal. Negative for fracture or focal lesion. Sinuses/Orbits: No acute finding. IMPRESSION: No acute finding.  Negative for intracranial hemorrhage. Electronically Signed   By: Ronnette Coke M.D.   On: 10/12/2023 06:38   US  Venous Img Lower Right (DVT Study) Result Date: 10/11/2023 CLINICAL DATA:  Right sided leg pain and swelling EXAM: Right LOWER EXTREMITY VENOUS DOPPLER ULTRASOUND TECHNIQUE: Gray-scale sonography with graded compression, as well as color Doppler and duplex ultrasound were performed to evaluate the lower extremity deep venous systems from the level of the common femoral vein and including the common femoral, femoral, profunda femoral, popliteal and calf veins including the posterior tibial,  peroneal and gastrocnemius veins when visible. The superficial great saphenous vein was also interrogated. Spectral Doppler was utilized to evaluate flow at rest and with distal augmentation maneuvers in the common femoral, femoral and popliteal veins. COMPARISON:  Ultrasound 10/05/2021 FINDINGS: Contralateral Common Femoral Vein: Respiratory phasicity is normal and symmetric with the symptomatic side. No evidence of thrombus. Normal compressibility. Common Femoral Vein: No evidence of thrombus. Normal compressibility, respiratory phasicity and response to augmentation. Saphenofemoral Junction: No evidence of thrombus. Normal compressibility and flow on color Doppler imaging. Profunda Femoral Vein: No evidence of thrombus. Normal compressibility and flow on color Doppler imaging. Femoral Vein: No evidence of thrombus. Normal compressibility, respiratory phasicity and response to augmentation. Popliteal Vein: No evidence of thrombus. Normal compressibility, respiratory phasicity and response to augmentation. Calf Veins: No evidence of thrombus. Normal compressibility and flow on color Doppler imaging. Limited visualization of the peroneal vein. Superficial Great Saphenous Vein: No evidence of thrombus. Normal compressibility. Venous Reflux:  None. Other Findings:  Scattered soft tissue edema. IMPRESSION: No evidence of right lower extremity DVT. Electronically Signed   By: Adrianna Horde M.D.   On: 10/11/2023 16:06   DG Chest 2 View Result Date: 10/11/2023 CLINICAL DATA:  Shortness of breath.  Leg pain for 3 days EXAM: CHEST - 2 VIEW  COMPARISON:  None Available. FINDINGS: Under penetrated radiographs. Enlarged cardiopericardial silhouette with some vascular congestion and possible interstitial edema. No pneumothorax or effusion. No consolidation. Overlapping cardiac leads. Films are under penetrated. Degenerative changes along the spine. Calcified aorta. Surgical clips in the upper abdomen. IMPRESSION: Enlarged heart  with some central vascular congestion. Interstitial prominence is also seen. Acute versus chronic such as edema. Please correlate with any prior or follow up. Under penetrated radiographs. Electronically Signed   By: Adrianna Horde M.D.   On: 10/11/2023 16:04     Scheduled Meds:  amiodarone  200 mg Oral BID   apixaban  5 mg Oral BID   furosemide  40 mg Intravenous Q8H   hydrALAZINE  25 mg Oral TID   lamoTRIgine  50 mg Oral BID   metoprolol tartrate  25 mg Oral BID   sodium chloride flush  3 mL Intravenous Q12H   spironolactone  25 mg Oral Daily   Continuous Infusions:  iron sucrose       LOS: 0 days    Time spent:    Deforest Fast, MD [facility]   10/12/2023, 10:23 AM

## 2023-10-12 NOTE — Plan of Care (Signed)

## 2023-10-13 ENCOUNTER — Inpatient Hospital Stay (HOSPITAL_COMMUNITY)

## 2023-10-13 DIAGNOSIS — I5033 Acute on chronic diastolic (congestive) heart failure: Secondary | ICD-10-CM

## 2023-10-13 LAB — CBC
HCT: 30.3 % — ABNORMAL LOW (ref 36.0–46.0)
Hemoglobin: 8.9 g/dL — ABNORMAL LOW (ref 12.0–15.0)
MCH: 24.5 pg — ABNORMAL LOW (ref 26.0–34.0)
MCHC: 29.4 g/dL — ABNORMAL LOW (ref 30.0–36.0)
MCV: 83.5 fL (ref 80.0–100.0)
Platelets: 288 10*3/uL (ref 150–400)
RBC: 3.63 MIL/uL — ABNORMAL LOW (ref 3.87–5.11)
RDW: 16.7 % — ABNORMAL HIGH (ref 11.5–15.5)
WBC: 7.4 10*3/uL (ref 4.0–10.5)
nRBC: 0.3 % — ABNORMAL HIGH (ref 0.0–0.2)

## 2023-10-13 LAB — ECHOCARDIOGRAM COMPLETE
AR max vel: 1.54 cm2
AV Area VTI: 1.83 cm2
AV Area mean vel: 1.75 cm2
AV Mean grad: 7.5 mmHg
AV Peak grad: 14.7 mmHg
Ao pk vel: 1.92 m/s
Area-P 1/2: 3.77 cm2
Height: 64 in
S' Lateral: 3 cm
Weight: 4045.88 [oz_av]

## 2023-10-13 LAB — BASIC METABOLIC PANEL WITH GFR
Anion gap: 13 (ref 5–15)
BUN: 12 mg/dL (ref 8–23)
CO2: 28 mmol/L (ref 22–32)
Calcium: 9 mg/dL (ref 8.9–10.3)
Chloride: 101 mmol/L (ref 98–111)
Creatinine, Ser: 1.19 mg/dL — ABNORMAL HIGH (ref 0.44–1.00)
GFR, Estimated: 46 mL/min — ABNORMAL LOW (ref >60)
Glucose, Bld: 108 mg/dL — ABNORMAL HIGH (ref 70–99)
Potassium: 3 mmol/L — ABNORMAL LOW (ref 3.5–5.1)
Sodium: 142 mmol/L (ref 135–145)

## 2023-10-13 LAB — ALBUMIN: Albumin: 3 g/dL — ABNORMAL LOW (ref 3.5–5.0)

## 2023-10-13 MED ORDER — IPRATROPIUM-ALBUTEROL 0.5-2.5 (3) MG/3ML IN SOLN: 3.0000 mL | RESPIRATORY_TRACT | Status: DC | PRN

## 2023-10-13 MED ORDER — HYDRALAZINE HCL 10 MG PO TABS
10.0000 mg | ORAL_TABLET | Freq: Three times a day (TID) | ORAL | Status: DC
  Administered 2023-10-13 – 2023-10-15 (×7): 10 mg via ORAL
  Filled 2023-10-13 (×7): qty 1

## 2023-10-13 MED ORDER — POTASSIUM CHLORIDE CRYS ER 20 MEQ PO TBCR
40.0000 meq | EXTENDED_RELEASE_TABLET | Freq: Once | ORAL | Status: AC
  Administered 2023-10-13: 40 meq via ORAL
  Filled 2023-10-13: qty 2

## 2023-10-13 MED ORDER — SODIUM CHLORIDE 0.9 % IV SOLN
200.0000 mg | INTRAVENOUS | Status: AC
  Administered 2023-10-13: 200 mg via INTRAVENOUS
  Filled 2023-10-13: qty 10

## 2023-10-13 MED ORDER — POTASSIUM CHLORIDE CRYS ER 20 MEQ PO TBCR
40.0000 meq | EXTENDED_RELEASE_TABLET | Freq: Three times a day (TID) | ORAL | Status: AC
  Administered 2023-10-13 (×3): 40 meq via ORAL
  Filled 2023-10-13 (×3): qty 2

## 2023-10-13 NOTE — Progress Notes (Signed)
 PROGRESS NOTE    Angie Figueroa  NWG:[id number] DOB: 1945-11-23 DOA: 10/11/2023 PCP: Liza Riggers., MD   78/F w chronic diastolic CHF, persistent A-fib on anticoagulation, history of ablation, recent DCCV last month, hypertension, CKD 3A, morbid obesity, Hypothyroidism, chronic pain, presented to ED with worsening dyspnea on exertion, edema, frequent falls, she also has a wound over her right knee> getting wound care.  Also reports 20 pound weight gain, compliant with diuretics. - In the ED, hypertensive, noted to be in rate controlled A-fib, labs noted BNP 1179, troponin 18, hemoglobin 9.3, creatinine 0.9   Subjective: -Ongoing weakness, falls, dyspnea and edema worse in the right leg  Assessment and Plan:  Acute on chronic HFpEF Last TTE 10/' 24 was a limited study unable to report LVEF, mild LVH and LA dilation noted - Improving with diuresis, 4.6 L negative, continue IV Lasix today, Aldactone -Follow-up repeat echo - Poor candidate for SGLT2i - Increase activity, PT OT eval    Iron deficiency anemia - Repeat IV iron, denies overt blood loss, continue Eliquis   Hypokalemia Repleted   Persistent A-fib: History of ablation, DCCV 4/24.  Continue home Eliquis, amiodarone 200 mg twice daily -Suspect she has failed attempts at rhythm control - Low-dose metoprolol added yesterday  Hypertension:  -Continue hydralazine, spironolactone 25 mg daily  CKD 3A: Baseline creatinine~1  Morbid obesity: Would benefit from weight loss outpatient  Hypothyroidism: - Continue levothyroxine, TSH mildly elevated however in the setting of acute illness we will recommend repeat in few weeks  Chronic pain: Continue home Percocet prn, tizanidine prn  ?  Mood disorder: Continue home lamotrigine   DVT prophylaxis: Eliquis Code Status: Full code Family Communication: Spouse at bedside. Disposition Plan: Home likely 1 to 2 days  Consultants:    Procedures:   Antimicrobials:     Objective: Vitals:   10/13/23 0050 10/13/23 0526 10/13/23 1008 10/13/23 1010  BP: 113/60 130/63 (!) 132/58 (!) 132/58  Pulse: 76 79 78 78  Resp: 20 (!) 22 20   Temp: 98.3 F (36.8 C) 97.8 F (36.6 C) 98 F (36.7 C)   TempSrc: Oral Oral Oral   SpO2: 93% 90% 93%   Weight:      Height:        Intake/Output Summary (Last 24 hours) at 10/13/2023 1045 Last data filed at 10/13/2023 1014 Gross per 24 hour  Intake 713.73 ml  Output 1850 ml  Net -1136.27 ml   Filed Weights   10/11/23 1337 10/11/23 2220 10/12/23 0500  Weight: 119.7 kg 116.3 kg 114.7 kg    Examination:  General exam: Obese chronically ill female laying in bed, AO x 3 HEENT: Positive JVD  CVS: S1-S2, irregular rhythm Lungs: Few basilar Rales, decreased breath sounds to bases Abdomen: Soft, nontender, bowel sounds present EXTR midis: 1-2+ edema, wound over right knee with pink granulation tissue and minimal surrounding purulence Psychiatry:  Mood & affect appropriate.     Data Reviewed:   CBC: Recent Labs  Lab 10/11/23 1344 10/12/23 0304 10/13/23 0224  WBC 8.6 7.4 7.4  HGB 9.3* 8.2* 8.9*  HCT 32.2* 28.2* 30.3*  MCV 85.6 84.9 83.5  PLT 272 251 288   Basic Metabolic Panel: Recent Labs  Lab 10/11/23 1438 10/12/23 0304 10/13/23 0224  NA 143 144 142  K 3.1* 3.4* 3.0*  CL 103 104 101  CO2 25 25 28   GLUCOSE 91 115* 108*  BUN 11 9 12   CREATININE 0.98 1.03* 1.19*  CALCIUM 9.2 8.6*  9.0  MG  --  2.0  --   PHOS  --  3.4  --    GFR: Estimated Creatinine Clearance: 46.8 mL/min (A) (by C-G formula based on SCr of 1.19 mg/dL (H)). Liver Function Tests: Recent Labs  Lab 10/13/23 0224  ALBUMIN 3.0*   No results for input(s): "LIPASE", "AMYLASE" in the last 168 hours. No results for input(s): "AMMONIA" in the last 168 hours. Coagulation Profile: No results for input(s): "INR", "PROTIME" in the last 168 hours. Cardiac Enzymes: No results for input(s): "CKTOTAL", "CKMB", "CKMBINDEX", "TROPONINI"  in the last 168 hours. BNP (last 3 results) Recent Labs    10/11/23 1344  PROBNP 1,179.0*   HbA1C: No results for input(s): "HGBA1C" in the last 72 hours. CBG: No results for input(s): "GLUCAP" in the last 168 hours. Lipid Profile: No results for input(s): "CHOL", "HDL", "LDLCALC", "TRIG", "CHOLHDL", "LDLDIRECT" in the last 72 hours. Thyroid Function Tests: Recent Labs    10/12/23 0304  TSH 7.804*   Anemia Panel: Recent Labs    10/12/23 0304  VITAMINB12 2,280*  FERRITIN 30  TIBC 378  IRON 18*   Urine analysis: No results found for: "COLORURINE", "APPEARANCEUR", "LABSPEC", "PHURINE", "GLUCOSEU", "HGBUR", "BILIRUBINUR", "KETONESUR", "PROTEINUR", "UROBILINOGEN", "NITRITE", "LEUKOCYTESUR" Sepsis Labs: @LABRCNTIP (procalcitonin:4,lacticidven:4)  ) Recent Results (from the past 240 hours)  Resp panel by RT-PCR (RSV, Flu A&B, Covid) Anterior Nasal Swab     Status: None   Collection Time: 10/11/23  6:41 PM   Specimen: Anterior Nasal Swab  Result Value Ref Range Status   SARS Coronavirus 2 by RT PCR NEGATIVE NEGATIVE Final    Comment: (NOTE) SARS-CoV-2 target nucleic acids are NOT DETECTED.  The SARS-CoV-2 RNA is generally detectable in upper respiratory specimens during the acute phase of infection. The lowest concentration of SARS-CoV-2 viral copies this assay can detect is 138 copies/mL. A negative result does not preclude SARS-Cov-2 infection and should not be used as the sole basis for treatment or other patient management decisions. A negative result may occur with  improper specimen collection/handling, submission of specimen other than nasopharyngeal swab, presence of viral mutation(s) within the areas targeted by this assay, and inadequate number of viral copies(<138 copies/mL). A negative result must be combined with clinical observations, patient history, and epidemiological information. The expected result is Negative.  Fact Sheet for Patients:   BloggerCourse.com  Fact Sheet for Healthcare Providers:  SeriousBroker.it  This test is no t yet approved or cleared by the [place]  FDA and  has been authorized for detection and/or diagnosis of SARS-CoV-2 by FDA under an Emergency Use Authorization (EUA). This EUA will remain  in effect (meaning this test can be used) for the duration of the COVID-19 declaration under Section 564(b)(1) of the Act, 21 U.S.C.section 360bbb-3(b)(1), unless the authorization is terminated  or revoked sooner.       Influenza A by PCR NEGATIVE NEGATIVE Final   Influenza B by PCR NEGATIVE NEGATIVE Final    Comment: (NOTE) The Xpert Xpress SARS-CoV-2/FLU/RSV plus assay is intended as an aid in the diagnosis of influenza from Nasopharyngeal swab specimens and should not be used as a sole basis for treatment. Nasal washings and aspirates are unacceptable for Xpert Xpress SARS-CoV-2/FLU/RSV testing.  Fact Sheet for Patients: BloggerCourse.com  Fact Sheet for Healthcare Providers: SeriousBroker.it  This test is not yet approved or cleared by the [place]  FDA and has been authorized for detection and/or diagnosis of SARS-CoV-2 by FDA under an Emergency Use Authorization (EUA). This EUA will  remain in effect (meaning this test can be used) for the duration of the COVID-19 declaration under Section 564(b)(1) of the Act, 21 U.S.C. section 360bbb-3(b)(1), unless the authorization is terminated or revoked.     Resp Syncytial Virus by PCR NEGATIVE NEGATIVE Final    Comment: (NOTE) Fact Sheet for Patients: BloggerCourse.com  Fact Sheet for Healthcare Providers: SeriousBroker.it  This test is not yet approved or cleared by the [place]  FDA and has been authorized for detection and/or diagnosis of SARS-CoV-2 by FDA under an Emergency Use  Authorization (EUA). This EUA will remain in effect (meaning this test can be used) for the duration of the COVID-19 declaration under Section 564(b)(1) of the Act, 21 U.S.C. section 360bbb-3(b)(1), unless the authorization is terminated or revoked.  Performed at [facility], [address] [place], [place] [address]      Radiology Studies: CT HEAD WO CONTRAST ( ) Result Date: 10/12/2023 CLINICAL DATA:  Fall on anticoagulation.  Rule out ICH EXAM: CT HEAD WITHOUT CONTRAST TECHNIQUE: Contiguous axial images were obtained from the base of the skull through the vertex without intravenous contrast. RADIATION DOSE REDUCTION: This exam was performed according to the departmental dose-optimization program which includes automated exposure control, adjustment of the mA and/or kV according to patient size and/or use of iterative reconstruction technique. COMPARISON:  None Available. FINDINGS: Brain: No evidence of acute infarction, hemorrhage, hydrocephalus, extra-axial collection or mass lesion/mass effect. Low-density beneath the left putamen is sizable and most likely a dilated perivascular space. Mild white matter low-density for age. Mild brain atrophy. 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 Vascular: No hyperdense vessel or unexpected calcification. Skull: Normal. Negative for fracture or focal lesion. Sinuses/Orbits: No acute finding. IMPRESSION: No acute finding.  Negative for intracranial hemorrhage. Electronically Signed   By: Ronnette Coke M.D.   On: 10/12/2023 06:38   US  Venous Img Lower Right (DVT Study) Result Date: 10/11/2023 CLINICAL DATA:  Right sided leg pain and swelling EXAM: Right LOWER EXTREMITY VENOUS DOPPLER ULTRASOUND TECHNIQUE: Gray-scale sonography with graded compression, as well as color Doppler and duplex ultrasound were performed to evaluate the lower extremity deep venous systems from  the level of the common femoral vein and including the common femoral, femoral, profunda femoral, popliteal and calf veins including the posterior tibial, peroneal and gastrocnemius veins when visible. The superficial great saphenous vein was also interrogated. Spectral Doppler was utilized to evaluate flow at rest and with distal augmentation maneuvers in the common femoral, femoral and popliteal veins. COMPARISON:  Ultrasound 10/05/2021 FINDINGS: Contralateral Common Femoral Vein: Respiratory phasicity is normal and symmetric with the symptomatic side. No evidence of thrombus. Normal compressibility. Common Femoral Vein: No evidence of thrombus. Normal compressibility, respiratory phasicity and response to augmentation. Saphenofemoral Junction: No evidence of thrombus. Normal compressibility and flow on color Doppler imaging. Profunda Femoral Vein: No evidence of thrombus. Normal compressibility and flow on color Doppler imaging. Femoral Vein: No evidence of thrombus. Normal compressibility, respiratory phasicity and response to augmentation. Popliteal Vein: No evidence of thrombus. Normal compressibility, respiratory phasicity and response to augmentation. Calf Veins: No evidence of thrombus. Normal compressibility and flow on color Doppler imaging. Limited visualization of the peroneal vein. Superficial Great Saphenous Vein: No evidence of thrombus. Normal compressibility. Venous Reflux:  None. Other Findings:  Scattered soft tissue edema. IMPRESSION: No evidence of right lower extremity DVT. Electronically Signed   By: Otho Blitz.D.  On: 10/11/2023 16:06   DG Chest 2 View Result Date: 10/11/2023 CLINICAL DATA:  Shortness of breath.  Leg pain for 3 days EXAM: CHEST - 2 VIEW COMPARISON:  None Available. FINDINGS: Under penetrated radiographs. Enlarged cardiopericardial silhouette with some vascular congestion and possible interstitial edema. No pneumothorax or effusion. No consolidation. Overlapping  cardiac leads. Films are under penetrated. Degenerative changes along the spine. Calcified aorta. Surgical clips in the upper abdomen. IMPRESSION: Enlarged heart with some central vascular congestion. Interstitial prominence is also seen. Acute versus chronic such as edema. Please correlate with any prior or follow up. Under penetrated radiographs. Electronically Signed   By: Adrianna Horde M.D.   On: 10/11/2023 16:04     Scheduled Meds:  amiodarone  200 mg Oral BID   apixaban  5 mg Oral BID   furosemide  40 mg Intravenous Q8H   hydrALAZINE  10 mg Oral TID   lamoTRIgine  50 mg Oral BID   metoprolol tartrate  25 mg Oral BID   potassium chloride  40 mEq Oral TID   sodium chloride flush  3 mL Intravenous Q12H   spironolactone  25 mg Oral Daily   Continuous Infusions:     LOS: 1 day    Time spent:    Deforest Fast, MD [facility]   10/13/2023, 10:45 AM

## 2023-10-13 NOTE — Evaluation (Signed)
 Occupational Therapy Evaluation Patient Details Name: Angie Figueroa MRN: [id number] DOB: Sep 07, 1945 Today's Date: 10/13/2023   History of Present Illness   Angie Figueroa is a 78 y.o. female who presented 10/11/23 as a transfer from [facility] ED for heart failure exacerbation. Pt presented with progressive LE edema with weeping fluid, DOE, and activity intolerance. Pt noted a weight gain of ~20lbs. Chest x-ray with vascular congestion and interstitial edema. PMH of heart failure with preserved ejection fraction, persistent A-fib on anticoagulation, ablation, recent DCCV last month, hypertension, CKD 3A, morbid obesity, hypothyroidism, and chronic pain.     Clinical Impressions At baseline, pt is Independent to Mod I with ADLs and IADLs and Mod I with functional mobility with a SPC or RW. Pt reports recent increased use of RW due to multiple recent falls. Pt also reports recent assistance for ADLs and IADLs from friend and husband due to falls and R knee wound. Pt now presents with decreased activity tolerance, generalized B UE weakness, pain affecting functional level, and decreased safety and independence with functional tasks. Pt currently demonstrates ability to complete UB ADLs with Mod I to Contact guard assist, LB ADLs with Set up to Mod assist, and bed mobility with Supervision to Min assist. Pt declined OOB activity this session due to R knee pain. Pt VSS on RA throughout session. Pt participated well and is motivated to return to PLOF. Pt will benefit from acute skilled OT services to address deficits outlined below and to increase safety and independence with functional tasks Post acute discharge, pt will benefit from [facility] OT to maximize rehab potential, decrease risk of falls, and decrease risk of rehospitalization.      If plan is discharge home, recommend the following:   A little help with walking and/or transfers;A lot of help with bathing/dressing/bathroom;Assistance with  cooking/housework;Assist for transportation;Help with stairs or ramp for entrance     Functional Status Assessment   Patient has had a recent decline in their functional status and demonstrates the ability to make significant improvements in function in a reasonable and predictable amount of time.     Equipment Recommendations   Other (comment) (Bariatric BSC)     Recommendations for Other Services         Precautions/Restrictions   Precautions Precautions: Fall Recall of Precautions/Restrictions: Intact Restrictions Weight Bearing Restrictions Per Provider Order: No     Mobility Bed Mobility Overal bed mobility: Needs Assistance Bed Mobility: Rolling, Supine to Sit, Sit to Supine Rolling: Supervision, Used rails   Supine to sit: Contact guard, HOB elevated Sit to supine: Contact guard assist, Min assist, HOB elevated   General bed mobility comments: Assist to elevate trunk into sit and elevate R LE into bed    Transfers Overall transfer level: Needs assistance                 General transfer comment: Pt declining transfers this session due to pain in R knee. Per PT note, pt performed STS and step-pivot transfers during PT eval on 5/10 with a RW with Contact guard assist.      Balance Overall balance assessment: Needs assistance Sitting-balance support: No upper extremity supported, Feet supported Sitting balance-Leahy Scale: Good                                     ADL either performed or assessed with clinical judgement   ADL Overall ADL's :  Needs assistance/impaired Eating/Feeding: Modified independent;Sitting   Grooming: Set up;Sitting   Upper Body Bathing: Contact guard assist;Sitting Upper Body Bathing Details (indicate cue type and reason): simulated Lower Body Bathing: Moderate assistance;Sitting/lateral leans Lower Body Bathing Details (indicate cue type and reason): simulated Upper Body Dressing : Contact guard  assist;Sitting Upper Body Dressing Details (indicate cue type and reason): assist for telemetry cords Lower Body Dressing: Minimal assistance;Moderate assistance;Sitting/lateral leans     Toilet Transfer Details (indicate cue type and reason): pt declined functional transfers this session due to R knee pain Toileting- Clothing Manipulation and Hygiene: Set up;Sitting/lateral lean Toileting - Clothing Manipulation Details (indicate cue type and reason): simulated       General ADL Comments: Pt with decreaseda ctivity tolerance and pain affecting funcitonal level.     Vision Baseline Vision/History: 1 Wears glasses (bifocals) Ability to See in Adequate Light: 0 Adequate (with glasses) Patient Visual Report: No change from baseline       Perception         Praxis         Pertinent Vitals/Pain Pain Assessment Pain Assessment: 0-10 Pain Score: 6  Pain Location: R Knee, worse with movement Pain Descriptors / Indicators: Discomfort, Aching, Tender, Grimacing Pain Intervention(s): Limited activity within patient's tolerance, Monitored during session, Repositioned     Extremity/Trunk Assessment Upper Extremity Assessment Upper Extremity Assessment: Right hand dominant;Generalized weakness   Lower Extremity Assessment Lower Extremity Assessment: Defer to PT evaluation   Cervical / Trunk Assessment Cervical / Trunk Assessment: Other exceptions Cervical / Trunk Exceptions: increased body habitus   Communication Communication Communication: No apparent difficulties   Cognition Arousal: Alert Behavior During Therapy: WFL for tasks assessed/performed Cognition: No apparent impairments             OT - Cognition Comments: Pt AAOx4 and pleasant throughout session. Pt cognition WFL for tasks assessed. Cognition not formally screened or evaluated.                 Following commands: Intact       Cueing  General Comments   Cueing Techniques: Verbal cues  VSS  on RA throughout session   Exercises     Shoulder Instructions      Home Living Family/patient expects to be discharged to:: Private residence Living Arrangements: Spouse/significant other;Non-relatives/Friends Available Help at Discharge: Family;Available 24 hours/day Type of Home: House Home Access: Stairs to enter [employer] of Steps: 3 Entrance Stairs-Rails: Right;Left;Can reach both Home Layout: Multi-level;Able to live on main level with bedroom/bathroom;Laundry or work area in Artist of Steps: Restaurant manager, fast food Shower/Tub: Producer, television/film/video: Handicapped height Bathroom Accessibility: Yes How Accessible: Accessible via walker Home Equipment: Agricultural consultant (2 wheels);Shower seat;Cane - single point;Wheelchair - manual;Grab bars - toilet;Grab bars - tub/shower          Prior Functioning/Environment Prior Level of Function : Independent/Modified Independent;Driving;History of Falls (last six months)             Mobility Comments: Ambulates using SPC in the house and RW in the community. Pt reports 9 falls in the last 106mo, two in the last week. Since the increase in falls she has been using the RW primarily. ADLs Comments: Jones Ness has began assisting her with bathing, dressing, toileting prn since her R knee wound developed. Prior to that, pt was Independent with ADLs. She is able to drive, but doesn't feel comfortable doing so. Currently relies on Janice/Jim for IADLs, but  typically Independent to Mod I with IADLs. Pt volunteers as a part of the [employer]. Really enjoys going to car shows.    OT Problem List: Decreased strength;Decreased activity tolerance;Decreased knowledge of use of DME or AE;Pain;Obesity   OT Treatment/Interventions: Self-care/ADL training;Therapeutic exercise;Energy conservation;DME and/or AE instruction;Therapeutic activities;Patient/family education;Balance training      OT  Goals(Current goals can be found in the care plan section)   Acute Rehab OT Goals Patient Stated Goal: to stop falling, return home, and be able to start volunteering again OT Goal Formulation: With patient Time For Goal Achievement: 10/27/23 Potential to Achieve Goals: Good ADL Goals Pt Will Perform Grooming: with modified independence;standing Pt Will Perform Lower Body Bathing: with contact guard assist;sitting/lateral leans;sit to/from stand (with adaptive equipment as needed) Pt Will Perform Lower Body Dressing: with supervision;sitting/lateral leans;sit to/from stand (with adaptive equipment as needed) Pt Will Transfer to Toilet: with modified independence;ambulating;bedside commode (with least restrictive AD) Pt/caregiver will Perform Home Exercise Program: Increased strength;Both right and left upper extremity;With theraputty;Independently;With written HEP provided (AROM progressing to theraband; increased activity tolerance) Additional ADL Goal #1: Patient will demonstrate ability to Independently state 3 fall prevention strategies to increase safety and independence during functional tasks with handout provided. Additional ADL Goal #2: Patient will demonstrate ability to Independently state 3 energy conservation strategies to increase safety and independence during functional tasks with handout provided.   OT Frequency:  Min 2X/week    Co-evaluation              AM-PAC OT "6 Clicks" Daily Activity     Outcome Measure Help from another person eating meals?: None Help from another person taking care of personal grooming?: A Little Help from another person toileting, which includes using toliet, bedpan, or urinal?: A Little Help from another person bathing (including washing, rinsing, drying)?: A Lot Help from another person to put on and taking off regular upper body clothing?: A Little Help from another person to put on and taking off regular lower body clothing?: A Lot 6  Click Score: 17   End of Session Nurse Communication: Mobility status  Activity Tolerance: Patient tolerated treatment well;Patient limited by pain Patient left: in bed;with call bell/phone within reach;with bed alarm set  OT Visit Diagnosis: Other abnormalities of gait and mobility (R26.89);Muscle weakness (generalized) (M62.81);Other (comment);Pain (decreased activity tolerance)                Time: 2130-8657 OT Time Calculation (min): 23 min Charges:  OT General Charges $OT Visit: 1 Visit OT Evaluation $OT Eval Low Complexity: 1 Low OT Treatments $Self Care/Home Management : 8-22 mins  Edgardo Petrenko "Darral Ellis., OTR/L, MA Acute Rehab [phone]  Walt Gunner 10/13/2023, 6:20 PM

## 2023-10-13 NOTE — Plan of Care (Signed)

## 2023-10-13 NOTE — Progress Notes (Signed)
 Dr. Michell Ahumada, on-call for attending, text paged regarding pt's AM potassium value of 3.0 in light of 0600 dose of IV Lasix.  Order received for PO replacement to be given prior to administration of Lasix. Will implement and monitor.

## 2023-10-14 DIAGNOSIS — I5033 Acute on chronic diastolic (congestive) heart failure: Secondary | ICD-10-CM | POA: Diagnosis not present

## 2023-10-14 LAB — CBC
HCT: 34.1 % — ABNORMAL LOW (ref 36.0–46.0)
Hemoglobin: 9.8 g/dL — ABNORMAL LOW (ref 12.0–15.0)
MCH: 24.3 pg — ABNORMAL LOW (ref 26.0–34.0)
MCHC: 28.7 g/dL — ABNORMAL LOW (ref 30.0–36.0)
MCV: 84.4 fL (ref 80.0–100.0)
Platelets: 323 10*3/uL (ref 150–400)
RBC: 4.04 MIL/uL (ref 3.87–5.11)
RDW: 17 % — ABNORMAL HIGH (ref 11.5–15.5)
WBC: 8.6 10*3/uL (ref 4.0–10.5)
nRBC: 0.7 % — ABNORMAL HIGH (ref 0.0–0.2)

## 2023-10-14 LAB — BASIC METABOLIC PANEL WITH GFR
Anion gap: 10 (ref 5–15)
BUN: 13 mg/dL (ref 8–23)
CO2: 28 mmol/L (ref 22–32)
Calcium: 9.3 mg/dL (ref 8.9–10.3)
Chloride: 105 mmol/L (ref 98–111)
Creatinine, Ser: 1.26 mg/dL — ABNORMAL HIGH (ref 0.44–1.00)
GFR, Estimated: 43 mL/min — ABNORMAL LOW (ref >60)
Glucose, Bld: 106 mg/dL — ABNORMAL HIGH (ref 70–99)
Potassium: 4.4 mmol/L (ref 3.5–5.1)
Sodium: 143 mmol/L (ref 135–145)

## 2023-10-14 MED ORDER — GLYCOPYRROLATE PF 0.2 MG/ML IJ SOSY
PREFILLED_SYRINGE | INTRAMUSCULAR | Status: AC
  Filled 2023-10-14: qty 1

## 2023-10-14 MED ORDER — PHENYLEPHRINE 80 MCG/ML (10ML) SYRINGE FOR IV PUSH (FOR BLOOD PRESSURE SUPPORT)
PREFILLED_SYRINGE | INTRAVENOUS | Status: AC
Start: 2023-10-14 — End: ?
  Filled 2023-10-14: qty 10

## 2023-10-14 MED ORDER — DEXAMETHASONE SODIUM PHOSPHATE 10 MG/ML IJ SOLN
INTRAMUSCULAR | Status: AC
  Filled 2023-10-14: qty 1

## 2023-10-14 MED ORDER — ONDANSETRON HCL 4 MG/2ML IJ SOLN
INTRAMUSCULAR | Status: AC
  Filled 2023-10-14: qty 2

## 2023-10-14 MED ORDER — FUROSEMIDE 10 MG/ML IJ SOLN: 40.0000 mg | Freq: Two times a day (BID) | INTRAMUSCULAR | Status: DC

## 2023-10-14 MED ORDER — POTASSIUM CHLORIDE CRYS ER 20 MEQ PO TBCR
40.0000 meq | EXTENDED_RELEASE_TABLET | Freq: Once | ORAL | Status: AC
  Administered 2023-10-14: 40 meq via ORAL
  Filled 2023-10-14: qty 2

## 2023-10-14 MED ORDER — FUROSEMIDE 10 MG/ML IJ SOLN
40.0000 mg | Freq: Two times a day (BID) | INTRAMUSCULAR | Status: DC
  Administered 2023-10-14 (×2): 40 mg via INTRAVENOUS
  Filled 2023-10-14 (×3): qty 4

## 2023-10-14 MED ORDER — EPHEDRINE 5 MG/ML INJ
INTRAVENOUS | Status: AC
  Filled 2023-10-14: qty 5

## 2023-10-14 MED ORDER — FENTANYL CITRATE (PF) 250 MCG/5ML IJ SOLN
INTRAMUSCULAR | Status: AC
  Filled 2023-10-14: qty 5

## 2023-10-14 NOTE — Progress Notes (Signed)
 Heart Failure Navigator Progress Note  Assessed for Heart & Vascular TOC clinic readiness.  Patient does not meet criteria due to follows with [facility] cardiology. Will not schedule HF TOC appt at this time.   Navigator available for reassessment of patient.   Jerilyn Monte, PharmD, BCPS Heart Failure Stewardship Pharmacist Phone [phone]

## 2023-10-14 NOTE — Progress Notes (Signed)
 Occupational Therapy Treatment Patient Details Name: Angie Figueroa MRN: [id number] DOB: 07/11/1945 Today's Date: 10/14/2023   History of present illness Angie Figueroa is a 78 y.o. female who presented 10/11/23 as a transfer from [facility] ED for heart failure exacerbation. Pt presented with progressive LE edema with weeping fluid, DOE, and activity intolerance. Pt noted a weight gain of ~20lbs. Chest x-ray with vascular congestion and interstitial edema. PMH of heart failure with preserved ejection fraction, persistent A-fib on anticoagulation, ablation, recent DCCV last month, hypertension, CKD 3A, morbid obesity, hypothyroidism, and chronic pain.   OT comments  Pt progressing towards OT goals this session. She was on [facility] when OT entered, performed peri care and grooming with set up from seated position, then able to ambulate to the door with RW and GCA. Pt did become SOB with ambulation, RR up to 31 SpO2 >90% on RA, R knee sore but not limiting mobility today. OT will continue to follow acutely and next session plan to address LB ADL and AE.  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       If plan is discharge home, recommend the following:  A little help with walking and/or transfers;A lot of help with bathing/dressing/bathroom;Assistance with cooking/housework;Assist for transportation;Help with stairs or ramp for entrance   Equipment Recommendations  Other (comment) (Bariatric BSC)    Recommendations for Other Services      Precautions / Restrictions Precautions Precautions: Fall Recall of Precautions/Restrictions: Intact Restrictions Weight Bearing Restrictions Per Provider Order: No       Mobility Bed Mobility Overal bed mobility: Needs Assistance Bed Mobility: Sit to Supine       Sit to supine: Contact guard assist   General bed mobility comments: Pt able to return supine without physical assist, cues for body  positioning in bed    Transfers Overall transfer level: Needs assistance Equipment used: Rolling walker (2 wheels) Transfers: Sit to/from Stand, Bed to chair/wheelchair/BSC Sit to Stand: Contact guard assist     Step pivot transfers: Contact guard assist     General transfer comment: Pt able to stand from [facility] with cues for safe hand placement     Balance Overall balance assessment: Needs assistance Sitting-balance support: No upper extremity supported, Feet supported Sitting balance-Leahy Scale: Good Sitting balance - Comments: Pt sat EOB and on BSC with supervision. She addressed pericare without assist.   Standing balance support: No upper extremity supported, Bilateral upper extremity supported, During functional activity, Reliant on assistive device for balance Standing balance-Leahy Scale: Fair                             ADL either performed or assessed with clinical judgement   ADL Overall ADL's : Needs assistance/impaired     Grooming: Set up;Sitting;Wash/dry hands Grooming Details (indicate cue type and reason): on [facility] post-peri care                 Toilet Transfer: Contact guard assist;Ambulation Toilet Transfer Details (indicate cue type and reason): on BSC when OT entered, then ambulated to door and back to bed post-peri care Toileting- Clothing Manipulation and Hygiene: Set up;Sitting/lateral lean Toileting - Clothing Manipulation Details (indicate cue type and reason): rear peri care       General ADL Comments: decreased activity tolerance    Extremity/Trunk Assessment Upper Extremity Assessment Upper Extremity Assessment: Right hand dominant   Lower Extremity Assessment Lower  Extremity Assessment: Defer to PT evaluation        Vision   Vision Assessment?: No apparent visual deficits   Perception     Praxis     Communication Communication Communication: No apparent difficulties   Cognition Arousal: Alert Behavior During  Therapy: WFL for tasks assessed/performed Cognition: No apparent impairments             OT - Cognition Comments: Pt AAOx4 and pleasant throughout session. Pt cognition WFL for tasks assessed. Cognition not formally screened or evaluated.                 Following commands: Intact        Cueing   Cueing Techniques: Verbal cues  Exercises      Shoulder Instructions       General Comments RR up to 31 with ambulation, SpO2 >90% on RA    Pertinent Vitals/ Pain       Pain Assessment Pain Assessment: Faces Faces Pain Scale: Hurts a little bit Pain Location: R Knee, worse with movement Pain Descriptors / Indicators: Discomfort, Aching, Tender, Grimacing Pain Intervention(s): Monitored during session, Repositioned  Home Living                                          Prior Functioning/Environment              Frequency  Min 2X/week        Progress Toward Goals  OT Goals(current goals can now be found in the care plan section)  Progress towards OT goals: Progressing toward goals  Acute Rehab OT Goals Patient Stated Goal: stop falling, return to volunteering OT Goal Formulation: With patient Time For Goal Achievement: 10/27/23 Potential to Achieve Goals: Good ADL Goals Pt Will Perform Grooming: with modified independence;standing Pt Will Perform Lower Body Bathing: with contact guard assist;sitting/lateral leans;sit to/from stand (with adaptive equipment as needed) Pt Will Perform Lower Body Dressing: with supervision;sitting/lateral leans;sit to/from stand (with adaptive equipment as needed) Pt Will Transfer to Toilet: with modified independence;ambulating;bedside commode (with least restrictive AD) Pt/caregiver will Perform Home Exercise Program: Increased strength;Both right and left upper extremity;With theraputty;Independently;With written HEP provided (AROM progressing to theraband; increased activity tolerance) Additional ADL Goal  #1: Patient will demonstrate ability to Independently state 3 fall prevention strategies to increase safety and independence during functional tasks with handout provided. Additional ADL Goal #2: Patient will demonstrate ability to Independently state 3 energy conservation strategies to increase safety and independence during functional tasks with handout provided.  Plan      Co-evaluation                 AM-PAC OT "6 Clicks" Daily Activity     Outcome Measure   Help from another person eating meals?: None Help from another person taking care of personal grooming?: A Little Help from another person toileting, which includes using toliet, bedpan, or urinal?: A Little Help from another person bathing (including washing, rinsing, drying)?: A Lot Help from another person to put on and taking off regular upper body clothing?: A Little Help from another person to put on and taking off regular lower body clothing?: A Lot 6 Click Score: 17    End of Session Equipment Utilized During Treatment: Gait belt;Rolling walker (2 wheels)  OT Visit Diagnosis: Other abnormalities of gait and mobility (R26.89);Muscle weakness (generalized) (M62.81);Other (comment);Pain (decreased activity  tolerance)   Activity Tolerance Patient tolerated treatment well   Patient Left in bed;with call bell/phone within reach;with bed alarm set   Nurse Communication Mobility status        Time: 4010-2725 OT Time Calculation (min): 25 min  Charges: OT General Charges $OT Visit: 1 Visit OT Treatments $Self Care/Home Management : 8-22 mins $Therapeutic Activity: 8-22 mins  Chales [place] OTR/L Acute Rehabilitation Services Office: [phone]   Ebony Goldstein [facility] 10/14/2023, 10:05 AM

## 2023-10-14 NOTE — Plan of Care (Signed)

## 2023-10-14 NOTE — Progress Notes (Signed)
 PROGRESS NOTE    Angie Figueroa  ZOX:[id number] DOB: Oct 21, 1945 DOA: 10/11/2023 PCP: Liza Riggers., MD   78/F w chronic diastolic CHF, persistent A-fib on anticoagulation, history of ablation, recent DCCV last month, hypertension, CKD 3A, morbid obesity, Hypothyroidism, chronic pain, presented to ED with worsening dyspnea on exertion, edema, frequent falls, she also has a wound over her right knee> getting wound care.  Also reports 20 pound weight gain, compliant with diuretics. - In the ED, hypertensive, noted to be in rate controlled A-fib, labs noted BNP 1179, troponin 18, hemoglobin 9.3, creatinine 0.9 - Admitted, started on diuretics, given IV iron   Subjective: - Feels much better overall, swelling gone down, ambulating better, breathing is improving  Assessment and Plan:  Acute on chronic HFpEF Last TTE 10/' 24 was a limited study unable to report LVEF, mild LVH and LA dilation noted -Repeat echo with preserved EF, indeterminate diastolic parameters, normal RV - Improving with diuresis, weight down 24 LB  - Cut down IV Lasix dose today, continue Aldactone,  - Poor candidate for SGLT2i - Increase activity, PT OT eval -Discharge planning, home tomorrow if stable    Iron deficiency anemia -Given IV iron, hemoglobin is improving, denies overt blood loss, continue Eliquis   Hypokalemia Repleted   Persistent A-fib: History of ablation, DCCV 4/24.  Continue home Eliquis, amiodarone 200 mg twice daily -Suspect she has failed attempts at rhythm control - Continue low-dose metoprolol  Hypertension:  -Continue hydralazine, spironolactone 25 mg daily  CKD 3A: Baseline creatinine~1  Morbid obesity: Would benefit from weight loss outpatient  Hypothyroidism: - Continue levothyroxine, TSH mildly elevated however in the setting of acute illness we will recommend repeat in few weeks  Chronic pain: Continue home Percocet prn, tizanidine prn  ?  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 Disposition Plan: Home likely tomorrow  Consultants:    Procedures:   Antimicrobials:    Objective: Vitals:   10/13/23 1958 10/14/23 0008 10/14/23 0415 10/14/23 0833  BP: 134/60 114/65 (!) 144/83 (!) 132/43  Pulse: 78 79 70 82  Resp: (!) 22 18 18  (!) 27  Temp: 98.3 F (36.8 C) 97.9 F (36.6 C) 97.9 F (36.6 C)   TempSrc: Oral Oral Oral   SpO2: 94% 98% 96% 100%  Weight:   108.9 kg   Height:        Intake/Output Summary (Last 24 hours) at 10/14/2023 0955 Last data filed at 10/14/2023 0420 Gross per 24 hour  Intake 486 ml  Output 1500 ml  Net -1014 ml   Filed Weights   10/11/23 2220 10/12/23 0500 10/14/23 0415  Weight: 116.3 kg 114.7 kg 108.9 kg    Examination:  General exam: Obese chronically ill female sitting up in the recliner, AO x 3 HEENT: No JVD CVS: S1-S2, irregular rhythm Lungs: Decreased breath sounds the bases otherwise clear Abdomen: Soft, nontender, bowel sounds present Extremities: Trace edema, wound over right knee with pink granulation tissue and minimal surrounding purulence Psychiatry:  Mood & affect appropriate.     Data Reviewed:   CBC: Recent Labs  Lab 10/11/23 1344 10/12/23 0304 10/13/23 0224 10/14/23 0255  WBC 8.6 7.4 7.4 8.6  HGB 9.3* 8.2* 8.9* 9.8*  HCT 32.2* 28.2* 30.3* 34.1*  MCV 85.6 84.9 83.5 84.4  PLT 272 251 288 323   Basic Metabolic Panel: Recent Labs  Lab 10/11/23 1438 10/12/23 0304 10/13/23 0224 10/14/23 0255  NA 143 144 142 143  K 3.1* 3.4* 3.0* 4.4  CL 103 104 101 105  CO2 25 25 28 28   GLUCOSE 91 115* 108* 106*  BUN 11 9 12 13   CREATININE 0.98 1.03* 1.19* 1.26*  CALCIUM 9.2 8.6* 9.0 9.3  MG  --  2.0  --   --   PHOS  --  3.4  --   --    GFR: Estimated Creatinine Clearance: 42.9 mL/min (A) (by C-G formula based on SCr of 1.26 mg/dL (H)). Liver Function Tests: Recent Labs  Lab 10/13/23 0224   ALBUMIN 3.0*   No results for input(s): "LIPASE", "AMYLASE" in the last 168 hours. No results for input(s): "AMMONIA" in the last 168 hours. Coagulation Profile: No results for input(s): "INR", "PROTIME" in the last 168 hours. Cardiac Enzymes: No results for input(s): "CKTOTAL", "CKMB", "CKMBINDEX", "TROPONINI" in the last 168 hours. BNP (last 3 results) Recent Labs    10/11/23 1344  PROBNP 1,179.0*   HbA1C: No results for input(s): "HGBA1C" in the last 72 hours. CBG: No results for input(s): "GLUCAP" in the last 168 hours. Lipid Profile: No results for input(s): "CHOL", "HDL", "LDLCALC", "TRIG", "CHOLHDL", "LDLDIRECT" in the last 72 hours. Thyroid Function Tests: Recent Labs    10/12/23 0304  TSH 7.804*   Anemia Panel: Recent Labs    10/12/23 0304  VITAMINB12 2,280*  FERRITIN 30  TIBC 378  IRON 18*   Urine analysis: No results found for: "COLORURINE", "APPEARANCEUR", "LABSPEC", "PHURINE", "GLUCOSEU", "HGBUR", "BILIRUBINUR", "KETONESUR", "PROTEINUR", "UROBILINOGEN", "NITRITE", "LEUKOCYTESUR" Sepsis Labs: @LABRCNTIP (procalcitonin:4,lacticidven:4)  ) Recent Results (from the past 240 hours)  Resp panel by RT-PCR (RSV, Flu A&B, Covid) Anterior Nasal Swab     Status: None   Collection Time: 10/11/23  6:41 PM   Specimen: Anterior Nasal Swab  Result Value Ref Range Status   SARS Coronavirus 2 by RT PCR NEGATIVE NEGATIVE Final    Comment: (NOTE) SARS-CoV-2 target nucleic acids are NOT DETECTED.  The SARS-CoV-2 RNA is generally detectable in upper respiratory specimens during the acute phase of infection. The lowest concentration of SARS-CoV-2 viral copies this assay can detect is 138 copies/mL. A negative result does not preclude SARS-Cov-2 infection and should not be used as the sole basis for treatment or other patient management decisions. A negative result may occur with  improper specimen collection/handling, submission of specimen other than nasopharyngeal  swab, presence of viral mutation(s) within the areas targeted by this assay, and inadequate number of viral copies(<138 copies/mL). A negative result must be combined with clinical observations, patient history, and epidemiological information. The expected result is Negative.  Fact Sheet for Patients:  BloggerCourse.com  Fact Sheet for Healthcare Providers:  SeriousBroker.it  This test is no t yet approved or cleared by the [place]  FDA and  has been authorized for detection and/or diagnosis of SARS-CoV-2 by FDA under an Emergency Use Authorization (EUA). This EUA will remain  in effect (meaning this test can be used) for the duration of the COVID-19 declaration under Section 564(b)(1) of the Act, 21 U.S.C.section 360bbb-3(b)(1), unless the authorization is terminated  or revoked sooner.       Influenza A by PCR NEGATIVE NEGATIVE Final   Influenza B by PCR NEGATIVE NEGATIVE Final    Comment: (NOTE) The Xpert Xpress SARS-CoV-2/FLU/RSV plus assay is intended as an aid in the diagnosis of influenza from Nasopharyngeal swab specimens and should not be used as a sole basis for treatment. Nasal washings and aspirates are unacceptable for  Xpert Xpress SARS-CoV-2/FLU/RSV testing.  Fact Sheet for Patients: BloggerCourse.com  Fact Sheet for Healthcare Providers: SeriousBroker.it  This test is not yet approved or cleared by the [place]  FDA and has been authorized for detection and/or diagnosis of SARS-CoV-2 by FDA under an Emergency Use Authorization (EUA). This EUA will remain in effect (meaning this test can be used) for the duration of the COVID-19 declaration under Section 564(b)(1) of the Act, 21 U.S.C. section 360bbb-3(b)(1), unless the authorization is terminated or revoked.     Resp Syncytial Virus by PCR NEGATIVE NEGATIVE Final    Comment: (NOTE) Fact Sheet for  Patients: BloggerCourse.com  Fact Sheet for Healthcare Providers: SeriousBroker.it  This test is not yet approved or cleared by the [place]  FDA and has been authorized for detection and/or diagnosis of SARS-CoV-2 by FDA under an Emergency Use Authorization (EUA). This EUA will remain in effect (meaning this test can be used) for the duration of the COVID-19 declaration under Section 564(b)(1) of the Act, 21 U.S.C. section 360bbb-3(b)(1), unless the authorization is terminated or revoked.  Performed at [facility], [address] [place], [place] [address]      Radiology Studies: ECHOCARDIOGRAM COMPLETE Result Date: 10/13/2023    ECHOCARDIOGRAM REPORT   Patient Name:   Marcel Paris Date of Exam: 10/13/2023 Medical Rec #:  [id number]         Height:       64.0 in Accession #:    [id number]        Weight:       252.9 lb Date of Birth:  07/24/1945          BSA:          2.161 m Patient Age:    78 years          BP:           130/63 mmHg Patient Gender: F                 HR:           81 bpm. Exam Location:  Inpatient Procedure: 2D Echo, Cardiac Doppler and Color Doppler (Both Spectral and Color            Flow Doppler were utilized during procedure). Indications:    CHF I50.9  History:        Patient has no prior history of Echocardiogram examinations.                 CHF.  Sonographer:    Terrilee Few RCS Referring Phys: JONATHAN SEGARS IMPRESSIONS  1. Left ventricular ejection fraction, by estimation, is 55 to 60%. The left ventricle has normal function. The left ventricle has no regional wall motion abnormalities. There is mild left ventricular hypertrophy. Left ventricular diastolic parameters are indeterminate.  2. Right ventricular systolic function is normal. The right ventricular size is mildly enlarged. Tricuspid regurgitation signal is inadequate for assessing PA pressure.  3. Left atrial size was moderately dilated.   4. The mitral valve is degenerative. Trivial mitral valve regurgitation. Moderate mitral annular calcification.  5. Tricuspid valve regurgitation is moderate.  6. The aortic valve is tricuspid. Aortic valve regurgitation is not visualized. Mild aortic valve stenosis.  7. The inferior vena cava is normal in size with greater than 50% respiratory variability, suggesting right atrial pressure of 3 mmHg.  8. Cannot exclude a small PFO. FINDINGS  Left Ventricle: Left ventricular ejection fraction, by estimation, is 55 to 60%.  The left ventricle has normal function. The left ventricle has no regional wall motion abnormalities. The left ventricular internal cavity size was normal in size. There is  mild left ventricular hypertrophy. Left ventricular diastolic parameters are indeterminate. Right Ventricle: The right ventricular size is mildly enlarged. No increase in right ventricular wall thickness. Right ventricular systolic function is normal. Tricuspid regurgitation signal is inadequate for assessing PA pressure. Left Atrium: Left atrial size was moderately dilated. Right Atrium: Right atrial size was normal in size. Pericardium: There is no evidence of pericardial effusion. Mitral Valve: The mitral valve is degenerative in appearance. Moderate mitral annular calcification. Trivial mitral valve regurgitation. Tricuspid Valve: The tricuspid valve is normal in structure. Tricuspid valve regurgitation is moderate. Aortic Valve: The aortic valve is tricuspid. Aortic valve regurgitation is not visualized. Mild aortic stenosis is present. Aortic valve mean gradient measures 7.5 mmHg. Aortic valve peak gradient measures 14.7 mmHg. Aortic valve area, by VTI measures 1.83 cm. Pulmonic Valve: The pulmonic valve was not well visualized. Pulmonic valve regurgitation is not visualized. Aorta: The aortic root and ascending aorta are structurally normal, with no evidence of dilitation. Venous: The inferior vena cava is normal in  size with greater than 50% respiratory variability, suggesting right atrial pressure of 3 mmHg. IAS/Shunts: Cannot exclude a small PFO.  LEFT VENTRICLE PLAX 2D LVIDd:         4.30 cm   Diastology LVIDs:         3.00 cm   LV e' medial:    6.84 cm/s LV PW:         1.00 cm   LV E/e' medial:  16.7 LV IVS:        0.80 cm   LV e' lateral:   12.40 cm/s LVOT diam:     1.90 cm   LV E/e' lateral: 9.2 LV SV:         67 LV SV Index:   31 LVOT Area:     2.84 cm  RIGHT VENTRICLE             IVC RV S prime:     14.60 cm/s  IVC diam: 1.60 cm TAPSE (M-mode): 1.9 cm LEFT ATRIUM            Index        RIGHT ATRIUM           Index LA diam:      4.60 cm  2.13 cm/m   RA Area:     18.60 cm LA Vol (A2C): 61.4 ml  28.38 ml/m  RA Volume:   48.20 ml  22.30 ml/m LA Vol (A4C): 129.0 ml 59.68 ml/m  AORTIC VALVE AV Area (Vmax):    1.54 cm AV Area (Vmean):   1.75 cm AV Area (VTI):     1.83 cm AV Vmax:           192.00 cm/s AV Vmean:          125.000 cm/s AV VTI:            0.364 m AV Peak Grad:      14.7 mmHg AV Mean Grad:      7.5 mmHg LVOT Vmax:         104.00 cm/s LVOT Vmean:        77.100 cm/s LVOT VTI:          0.235 m LVOT/AV VTI ratio: 0.65  AORTA Ao Asc diam: 3.60 cm MITRAL VALVE MV Area (PHT): 3.77 cm  SHUNTS MV Decel Time: 201 msec     Systemic VTI:  0.24 m MV E velocity: 114.00 cm/s  Systemic Diam: 1.90 cm Carson Clara MD Electronically signed by Carson Clara MD Signature Date/Time: 10/13/2023/12:01:25 PM    Final      Scheduled Meds:  amiodarone  200 mg Oral BID   apixaban  5 mg Oral BID   furosemide  40 mg Intravenous Q12H   hydrALAZINE  10 mg Oral TID   lamoTRIgine  50 mg Oral BID   metoprolol tartrate  25 mg Oral BID   sodium chloride flush  3 mL Intravenous Q12H   spironolactone  25 mg Oral Daily   Continuous Infusions:     LOS: 2 days    Time spent:    Deforest Fast, MD [facility]   10/14/2023, 9:55 AM

## 2023-10-14 NOTE — Progress Notes (Signed)
 Mobility Specialist: Progress Note   10/14/23 1612  Mobility  Activity Transferred from bed to chair  Level of Assistance Contact guard assist, steadying assist  Assistive Device Front wheel walker  Activity Response Tolerated well  Mobility Referral Yes  Mobility visit 1 Mobility  Mobility Specialist Start Time (ACUTE ONLY) 1142  Mobility Specialist Stop Time (ACUTE ONLY) 1152  Mobility Specialist Time Calculation (min) (ACUTE ONLY) 10 min    Received pt in bed having no complaints and agreeable to mobility. Pt was asymptomatic throughout ambulation and returned to room w/o fault. Left in chair w/ call bell in reach and all needs met.   Deloria Fetch Mobility Specialist Please contact via SecureChat or Rehab office at [phone]

## 2023-10-14 NOTE — Plan of Care (Signed)
   Problem: Education: Goal: Knowledge of General Education information will improve Description: Including pain rating scale, medication(s)/side effects and non-pharmacologic comfort measures Outcome: Progressing   Problem: Activity: Goal: Risk for activity intolerance will decrease Outcome: Progressing   Problem: Pain Managment: Goal: General experience of comfort will improve and/or be controlled Outcome: Progressing

## 2023-10-14 NOTE — Progress Notes (Signed)
 Mobility Specialist: Progress Note   10/14/23 1613  Mobility  Activity Transferred from chair to bed  Level of Assistance Contact guard assist, steadying assist  Assistive Device Front wheel walker  Activity Response Tolerated well  Mobility Referral Yes  Mobility visit 1 Mobility  Mobility Specialist Start Time (ACUTE ONLY) 1405  Mobility Specialist Stop Time (ACUTE ONLY) 1412  Mobility Specialist Time Calculation (min) (ACUTE ONLY) 7 min    Received pt in chair having no complaints and agreeable to mobility. Pt was asymptomatic throughout ambulation and returned to room w/o fault. Left in bed w/ call bell in reach and all needs met.   Deloria Fetch Mobility Specialist Please contact via SecureChat or Rehab office at [phone]

## 2023-10-15 ENCOUNTER — Other Ambulatory Visit (HOSPITAL_COMMUNITY): Payer: Self-pay

## 2023-10-15 DIAGNOSIS — I5033 Acute on chronic diastolic (congestive) heart failure: Secondary | ICD-10-CM | POA: Diagnosis not present

## 2023-10-15 LAB — BASIC METABOLIC PANEL WITH GFR
Anion gap: 12 (ref 5–15)
BUN: 20 mg/dL (ref 8–23)
CO2: 26 mmol/L (ref 22–32)
Calcium: 9.2 mg/dL (ref 8.9–10.3)
Chloride: 103 mmol/L (ref 98–111)
Creatinine, Ser: 1.52 mg/dL — ABNORMAL HIGH (ref 0.44–1.00)
GFR, Estimated: 34 mL/min — ABNORMAL LOW (ref >60)
Glucose, Bld: 113 mg/dL — ABNORMAL HIGH (ref 70–99)
Potassium: 4.1 mmol/L (ref 3.5–5.1)
Sodium: 141 mmol/L (ref 135–145)

## 2023-10-15 MED ORDER — AMIODARONE HCL 200 MG PO TABS: 200.0000 mg | ORAL_TABLET | Freq: Every day | ORAL | Status: AC

## 2023-10-15 MED ORDER — FUROSEMIDE 40 MG PO TABS
40.0000 mg | ORAL_TABLET | Freq: Two times a day (BID) | ORAL | Status: DC
  Administered 2023-10-15: 40 mg via ORAL
  Filled 2023-10-15: qty 1

## 2023-10-15 MED ORDER — METOPROLOL TARTRATE 25 MG PO TABS: 25.0000 mg | ORAL_TABLET | Freq: Two times a day (BID) | ORAL | 0 refills | Status: AC

## 2023-10-15 MED ORDER — POTASSIUM CHLORIDE CRYS ER 20 MEQ PO TBCR
40.0000 meq | EXTENDED_RELEASE_TABLET | Freq: Every day | ORAL | 0 refills | Status: AC
  Filled 2023-10-15: qty 60, 30d supply, fill #0

## 2023-10-15 MED ORDER — FUROSEMIDE 20 MG PO TABS: 40.0000 mg | ORAL_TABLET | Freq: Two times a day (BID) | ORAL | 0 refills | Status: AC

## 2023-10-15 MED ORDER — HYDRALAZINE HCL 10 MG PO TABS: 10.0000 mg | ORAL_TABLET | Freq: Two times a day (BID) | ORAL | 0 refills | Status: AC

## 2023-10-15 NOTE — Discharge Instructions (Signed)
 Follow-up with cardiology in few weeks, needs labs, BMP check in 1 week

## 2023-10-15 NOTE — Care Management Important Message (Signed)
 Important Message  Patient Details  Name: Angie Figueroa MRN: [id number] Date of Birth: 1946-02-25   Important Message Given:  Yes - Medicare IM     Janith Melnick 10/15/2023, 9:34 AM

## 2023-10-15 NOTE — Progress Notes (Signed)
 PT Cancellation Note  Patient Details Name: Angie Figueroa MRN: [id number] DOB: 02-Sep-1945   Cancelled Treatment:    Reason Eval/Treat Not Completed: Other (comment). Pt and spouse declined services as patient with d/c orders and RN getting patient ready for d/c home. Pt and spouse report having all needed equip and support needed at home.   Claudio Mondry, PT, DPT Acute Rehabilitation Services Secure chat preferred Office #: [phone]    Jenna Moan 10/15/2023, 9:46 AM

## 2023-10-15 NOTE — Discharge Summary (Signed)
 Physician Discharge Summary  Angie Figueroa ZOX:[id number] DOB: 1946-01-06 DOA: 10/11/2023  PCP: Liza Riggers., MD  Admit date: 10/11/2023 Discharge date: 10/15/2023  Time spent:45 minutes  Recommendations for Outpatient Follow-up:  Cardiology at [facility] in 2 to 3 weeks, please check BMP at follow-up   Discharge Diagnoses:  Acute on chronic diastolic CHF Iron deficiency anemia Persistent atrial fibrillation Hypertension CKD 3 AA Morbid obesity Hypothyroidism Chronic pain   Hypokalemia   Normocytic anemia   Discharge Condition: improved  Diet recommendation: Low-sodium, heart healthy  Filed Weights   10/12/23 0500 10/14/23 0415 10/15/23 0435  Weight: 114.7 kg 108.9 kg 108.8 kg    History of present illness:  78/F w chronic diastolic CHF, persistent A-fib on anticoagulation, history of ablation, recent DCCV last month, hypertension, CKD 3A, morbid obesity, Hypothyroidism, chronic pain, presented to ED with worsening dyspnea on exertion, edema, frequent falls, she also has a wound over her right knee> getting wound care.  Also reports 20 pound weight gain, compliant with diuretics. - In the ED, hypertensive, noted to be in rate controlled A-fib, labs noted BNP 1179, troponin 18, hemoglobin 9.3, creatinine 0.9 - Admitted, started on diuretics, given IV iron  Hospital Course:   Acute on chronic HFpEF Last TTE 10/' 24 was a limited study unable to report LVEF, mild LVH and LA dilation noted -Repeat echo with preserved EF, indeterminate diastolic parameters, normal RV - Improving with diuresis, weight down 25 LB  - Volume status has improved, transition to oral Lasix 40 Mg twice daily, continue Aldactone,  - Poor candidate for SGLT2i - Discharged home in a stable condition, follow-up with cardiology at [facility]    Iron deficiency anemia -Given IV iron, hemoglobin is improving, denies overt blood loss, continue Eliquis   Hypokalemia Repleted   Persistent A-fib: History of  ablation, DCCV 4/24.  Continue home Eliquis, amiodarone 200 mg twice daily -Suspect she has failed attempts at rhythm control - Continue low-dose metoprolol   Hypertension:  -Continue hydralazine, spironolactone 25 mg daily   CKD 3A: Baseline creatinine~1   Morbid obesity: Would benefit from weight loss outpatient   Hypothyroidism: - Continue levothyroxine, TSH mildly elevated however in the setting of acute illness we will recommend repeat in few weeks   Chronic pain: Continue home Percocet prn, tizanidine prn   ?  Mood disorder: Continue home lamotrigine  Discharge Exam: Vitals:   10/15/23 0435 10/15/23 0858  BP: (!) 102/55 132/67  Pulse: 72 79  Resp: 20   Temp: 97.7 F (36.5 C)   SpO2: 95%    General exam: Obese chronically ill female sitting up in the recliner, AO x 3 HEENT: No JVD CVS: S1-S2, irregular rhythm Lungs: Decreased breath sounds the bases otherwise clear Abdomen: Soft, nontender, bowel sounds present Extremities: Trace edema, wound over right knee with pink granulation tissue and minimal surrounding purulence Psychiatry:  Mood & affect appropriate.     Discharge Instructions   Discharge Instructions     Diet - low sodium heart healthy   Complete by: As directed    Discharge wound care:   Complete by: As directed    Cleanse right knee wound with normal saline, apply Xeroform gauze to wound bed every day, secure with Kerlix roll gauze or silicone foam   Increase activity slowly   Complete by: As directed       Allergies as of 10/15/2023       Reactions   Lyrica [pregabalin] Other (See Comments)   Weight gain  Prozac [fluoxetine] Other (See Comments)   Irritability        Medication List     STOP taking these medications    diltiazem 360 MG 24 hr tablet Commonly known as: CARDIZEM LA   levofloxacin 500 MG tablet Commonly known as: LEVAQUIN       TAKE these medications    acetaminophen 500 MG tablet Commonly known as:  TYLENOL Take 1,000 mg by mouth every 6 (six) hours as needed for headache, fever or moderate pain (pain score 4-6).   amiodarone 200 MG tablet Commonly known as: PACERONE Take 1 tablet (200 mg total) by mouth daily. What changed: how much to take   clotrimazole-betamethasone cream Commonly known as: LOTRISONE Apply 1 Application topically 2 (two) times daily as needed (skin irritation).   Eliquis 5 MG Tabs tablet Generic drug: apixaban Take 5 mg by mouth 2 (two) times daily.   furosemide 20 MG tablet Commonly known as: LASIX Take 2 tablets (40 mg total) by mouth 2 (two) times daily. What changed: when to take this   hydrALAZINE 10 MG tablet Commonly known as: APRESOLINE Take 1 tablet (10 mg total) by mouth 2 (two) times daily. What changed:  medication strength how much to take when to take this   HYDROcodone-acetaminophen 5-325 MG tablet Commonly known as: NORCO/VICODIN Take 1 tablet by mouth every 6 (six) hours as needed for moderate pain (pain score 4-6).   ipratropium-albuterol 0.5-2.5 (3) MG/3ML Soln Commonly known as: DUONEB Take 3 mLs by nebulization every 6 (six) hours as needed (wheezing, shortness of breath).   lamoTRIgine 100 MG tablet Commonly known as: LAMICTAL Take 100 mg by mouth 2 (two) times daily.   levothyroxine 25 MCG tablet Commonly known as: SYNTHROID Take 25 mcg by mouth daily before breakfast.   lidocaine 2 % solution Commonly known as: XYLOCAINE 10 mLs See admin instructions. Apply 10mL TOPICALLY every 4 hours as needed for mild pain.   LORazepam 0.5 MG tablet Commonly known as: ATIVAN Take 0.5 mg by mouth every 8 (eight) hours as needed for anxiety.   meclizine 25 MG tablet Commonly known as: ANTIVERT Take 25 mg by mouth 3 (three) times daily as needed for dizziness.   metoprolol tartrate 25 MG tablet Commonly known as: LOPRESSOR Take 1 tablet (25 mg total) by mouth 2 (two) times daily.   spironolactone 25 MG tablet Commonly  known as: ALDACTONE Take 25 mg by mouth daily.   tiZANidine 4 MG tablet Commonly known as: ZANAFLEX Take 4 mg by mouth every 6 (six) hours as needed for muscle spasms.   VITAMIN B-12 PO Take 1 tablet by mouth daily.               Discharge Care Instructions  (From admission, onward)           Start     Ordered   10/15/23 0000  Discharge wound care:       Comments: Cleanse right knee wound with normal saline, apply Xeroform gauze to wound bed every day, secure with Kerlix roll gauze or silicone foam   10/15/23 0923           Allergies  Allergen Reactions   Lyrica [Pregabalin] Other (See Comments)    Weight gain   Prozac [Fluoxetine] Other (See Comments)    Irritability      The results of significant diagnostics from this hospitalization (including imaging, microbiology, ancillary and laboratory) are listed below for reference.    Significant Diagnostic Studies:  ECHOCARDIOGRAM COMPLETE Result Date: 10/13/2023    ECHOCARDIOGRAM REPORT   Patient Name:   Angie Figueroa Date of Exam: 10/13/2023 Medical Rec #:  [id number]         Height:       64.0 in Accession #:    [id number]        Weight:       252.9 lb Date of Birth:  01-29-1946          BSA:          2.161 m Patient Age:    78 years          BP:           130/63 mmHg Patient Gender: F                 HR:           81 bpm. Exam Location:  Inpatient Procedure: 2D Echo, Cardiac Doppler and Color Doppler (Both Spectral and Color            Flow Doppler were utilized during procedure). Indications:    CHF I50.9  History:        Patient has no prior history of Echocardiogram examinations.                 CHF.  Sonographer:    Terrilee Few RCS Referring Phys: JONATHAN SEGARS IMPRESSIONS  1. Left ventricular ejection fraction, by estimation, is 55 to 60%. The left ventricle has normal function. The left ventricle has no regional wall motion abnormalities. There is mild left ventricular hypertrophy. Left ventricular  diastolic parameters are indeterminate.  2. Right ventricular systolic function is normal. The right ventricular size is mildly enlarged. Tricuspid regurgitation signal is inadequate for assessing PA pressure.  3. Left atrial size was moderately dilated.  4. The mitral valve is degenerative. Trivial mitral valve regurgitation. Moderate mitral annular calcification.  5. Tricuspid valve regurgitation is moderate.  6. The aortic valve is tricuspid. Aortic valve regurgitation is not visualized. Mild aortic valve stenosis.  7. The inferior vena cava is normal in size with greater than 50% respiratory variability, suggesting right atrial pressure of 3 mmHg.  8. Cannot exclude a small PFO. FINDINGS  Left Ventricle: Left ventricular ejection fraction, by estimation, is 55 to 60%. The left ventricle has normal function. The left ventricle has no regional wall motion abnormalities. The left ventricular internal cavity size was normal in size. There is  mild left ventricular hypertrophy. Left ventricular diastolic parameters are indeterminate. Right Ventricle: The right ventricular size is mildly enlarged. No increase in right ventricular wall thickness. Right ventricular systolic function is normal. Tricuspid regurgitation signal is inadequate for assessing PA pressure. Left [facility]: Left atrial size was moderately dilated. Right [facility]: Right atrial size was normal in size. Pericardium: There is no evidence of pericardial effusion. Mitral Valve: The mitral valve is degenerative in appearance. Moderate mitral annular calcification. Trivial mitral valve regurgitation. Tricuspid Valve: The tricuspid valve is normal in structure. Tricuspid valve regurgitation is moderate. Aortic Valve: The aortic valve is tricuspid. Aortic valve regurgitation is not visualized. Mild aortic stenosis is present. Aortic valve mean gradient measures 7.5 mmHg. Aortic valve peak gradient measures 14.7 mmHg. Aortic valve area, by VTI measures 1.83 cm.  Pulmonic Valve: The pulmonic valve was not well visualized. Pulmonic valve regurgitation is not visualized. Aorta: The aortic root and ascending aorta are structurally normal, with no evidence of dilitation. Venous: The inferior vena cava is normal in size  with greater than 50% respiratory variability, suggesting right atrial pressure of 3 mmHg. IAS/Shunts: Cannot exclude a small PFO.  LEFT VENTRICLE PLAX 2D LVIDd:         4.30 cm   Diastology LVIDs:         3.00 cm   LV e' medial:    6.84 cm/s LV PW:         1.00 cm   LV E/e' medial:  16.7 LV IVS:        0.80 cm   LV e' lateral:   12.40 cm/s LVOT diam:     1.90 cm   LV E/e' lateral: 9.2 LV SV:         67 LV SV Index:   31 LVOT Area:     2.84 cm  RIGHT VENTRICLE             IVC RV S prime:     14.60 cm/s  IVC diam: 1.60 cm TAPSE (M-mode): 1.9 cm LEFT ATRIUM            Index        RIGHT ATRIUM           Index LA diam:      4.60 cm  2.13 cm/m   RA Area:     18.60 cm LA Vol (A2C): 61.4 ml  28.38 ml/m  RA Volume:   48.20 ml  22.30 ml/m LA Vol (A4C): 129.0 ml 59.68 ml/m  AORTIC VALVE AV Area (Vmax):    1.54 cm AV Area (Vmean):   1.75 cm AV Area (VTI):     1.83 cm AV Vmax:           192.00 cm/s AV Vmean:          125.000 cm/s AV VTI:            0.364 m AV Peak Grad:      14.7 mmHg AV Mean Grad:      7.5 mmHg LVOT Vmax:         104.00 cm/s LVOT Vmean:        77.100 cm/s LVOT VTI:          0.235 m LVOT/AV VTI ratio: 0.65  AORTA Ao Asc diam: 3.60 cm MITRAL VALVE MV Area (PHT): 3.77 cm     SHUNTS MV Decel Time: 201 msec     Systemic VTI:  0.24 m MV E velocity: 114.00 cm/s  Systemic Diam: 1.90 cm Carson Clara MD Electronically signed by Carson Clara MD Signature Date/Time: 10/13/2023/12:01:25 PM    Final    CT HEAD WO CONTRAST ( ) Result Date: 10/12/2023 CLINICAL DATA:  Fall on anticoagulation.  Rule out ICH EXAM: CT HEAD WITHOUT CONTRAST TECHNIQUE: Contiguous axial images were obtained from the base of the skull through the vertex without  intravenous contrast. RADIATION DOSE REDUCTION: This exam was performed according to the departmental dose-optimization program which includes automated exposure control, adjustment of the mA and/or kV according to patient size and/or use of iterative reconstruction technique. COMPARISON:  None Available. FINDINGS: Brain: No evidence of acute infarction, hemorrhage, hydrocephalus, extra-axial collection or mass lesion/mass effect. Low-density beneath the left putamen is sizable and most likely a dilated perivascular space. Mild white matter low-density for age. Mild brain atrophy. 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 Vascular: No hyperdense vessel or unexpected calcification. Skull: Normal. Negative for fracture or focal lesion. Sinuses/Orbits: No acute finding. IMPRESSION:  No acute finding.  Negative for intracranial hemorrhage. Electronically Signed   By: Ronnette Coke M.D.   On: 10/12/2023 06:38   US  Venous Img Lower Right (DVT Study) Result Date: 10/11/2023 CLINICAL DATA:  Right sided leg pain and swelling EXAM: Right LOWER EXTREMITY VENOUS DOPPLER ULTRASOUND TECHNIQUE: Gray-scale sonography with graded compression, as well as color Doppler and duplex ultrasound were performed to evaluate the lower extremity deep venous systems from the level of the common femoral vein and including the common femoral, femoral, profunda femoral, popliteal and calf veins including the posterior tibial, peroneal and gastrocnemius veins when visible. The superficial great saphenous vein was also interrogated. Spectral Doppler was utilized to evaluate flow at rest and with distal augmentation maneuvers in the common femoral, femoral and popliteal veins. COMPARISON:  Ultrasound 10/05/2021 FINDINGS: Contralateral Common Femoral Vein: Respiratory phasicity is normal and symmetric with the symptomatic side. No evidence of thrombus. Normal compressibility.  Common Femoral Vein: No evidence of thrombus. Normal compressibility, respiratory phasicity and response to augmentation. Saphenofemoral Junction: No evidence of thrombus. Normal compressibility and flow on color Doppler imaging. Profunda Femoral Vein: No evidence of thrombus. Normal compressibility and flow on color Doppler imaging. Femoral Vein: No evidence of thrombus. Normal compressibility, respiratory phasicity and response to augmentation. Popliteal Vein: No evidence of thrombus. Normal compressibility, respiratory phasicity and response to augmentation. Calf Veins: No evidence of thrombus. Normal compressibility and flow on color Doppler imaging. Limited visualization of the peroneal vein. Superficial Great Saphenous Vein: No evidence of thrombus. Normal compressibility. Venous Reflux:  None. Other Findings:  Scattered soft tissue edema. IMPRESSION: No evidence of right lower extremity DVT. Electronically Signed   By: Adrianna Horde M.D.   On: 10/11/2023 16:06   DG Chest 2 View Result Date: 10/11/2023 CLINICAL DATA:  Shortness of breath.  Leg pain for 3 days EXAM: CHEST - 2 VIEW COMPARISON:  None Available. FINDINGS: Under penetrated radiographs. Enlarged cardiopericardial silhouette with some vascular congestion and possible interstitial edema. No pneumothorax or effusion. No consolidation. Overlapping cardiac leads. Films are under penetrated. Degenerative changes along the spine. Calcified aorta. Surgical clips in the upper abdomen. IMPRESSION: Enlarged heart with some central vascular congestion. Interstitial prominence is also seen. Acute versus chronic such as edema. Please correlate with any prior or follow up. Under penetrated radiographs. Electronically Signed   By: Adrianna Horde M.D.   On: 10/11/2023 16:04    Microbiology: Recent Results (from the past 240 hours)  Resp panel by RT-PCR (RSV, Flu A&B, Covid) Anterior Nasal Swab     Status: None   Collection Time: 10/11/23  6:41 PM   Specimen:  Anterior Nasal Swab  Result Value Ref Range Status   SARS Coronavirus 2 by RT PCR NEGATIVE NEGATIVE Final    Comment: (NOTE) SARS-CoV-2 target nucleic acids are NOT DETECTED.  The SARS-CoV-2 RNA is generally detectable in upper respiratory specimens during the acute phase of infection. The lowest concentration of SARS-CoV-2 viral copies this assay can detect is 138 copies/mL. A negative result does not preclude SARS-Cov-2 infection and should not be used as the sole basis for treatment or other patient management decisions. A negative result may occur with  improper specimen collection/handling, submission of specimen other than nasopharyngeal swab, presence of viral mutation(s) within the areas targeted by this assay, and inadequate number of viral copies(<138 copies/mL). A negative result must be combined with clinical observations, patient history, and epidemiological information. The expected result is Negative.  Fact Sheet for Patients:  BloggerCourse.com  Fact Sheet for Healthcare Providers:  SeriousBroker.it  This test is no t yet approved or cleared by the [place]  FDA and  has been authorized for detection and/or diagnosis of SARS-CoV-2 by FDA under an Emergency Use Authorization (EUA). This EUA will remain  in effect (meaning this test can be used) for the duration of the COVID-19 declaration under Section 564(b)(1) of the Act, 21 U.S.C.section 360bbb-3(b)(1), unless the authorization is terminated  or revoked sooner.       Influenza A by PCR NEGATIVE NEGATIVE Final   Influenza B by PCR NEGATIVE NEGATIVE Final    Comment: (NOTE) The Xpert Xpress SARS-CoV-2/FLU/RSV plus assay is intended as an aid in the diagnosis of influenza from Nasopharyngeal swab specimens and should not be used as a sole basis for treatment. Nasal washings and aspirates are unacceptable for Xpert Xpress SARS-CoV-2/FLU/RSV testing.  Fact  Sheet for Patients: BloggerCourse.com  Fact Sheet for Healthcare Providers: SeriousBroker.it  This test is not yet approved or cleared by the [place]  FDA and has been authorized for detection and/or diagnosis of SARS-CoV-2 by FDA under an Emergency Use Authorization (EUA). This EUA will remain in effect (meaning this test can be used) for the duration of the COVID-19 declaration under Section 564(b)(1) of the Act, 21 U.S.C. section 360bbb-3(b)(1), unless the authorization is terminated or revoked.     Resp Syncytial Virus by PCR NEGATIVE NEGATIVE Final    Comment: (NOTE) Fact Sheet for Patients: BloggerCourse.com  Fact Sheet for Healthcare Providers: SeriousBroker.it  This test is not yet approved or cleared by the [place]  FDA and has been authorized for detection and/or diagnosis of SARS-CoV-2 by FDA under an Emergency Use Authorization (EUA). This EUA will remain in effect (meaning this test can be used) for the duration of the COVID-19 declaration under Section 564(b)(1) of the Act, 21 U.S.C. section 360bbb-3(b)(1), unless the authorization is terminated or revoked.  Performed at [facility], [address] [place], [place] [address]      Labs: Basic Metabolic Panel: Recent Labs  Lab 10/11/23 1438 10/12/23 0304 10/13/23 0224 10/14/23 0255 10/15/23 0400  NA 143 144 142 143 141  K 3.1* 3.4* 3.0* 4.4 4.1  CL 103 104 101 105 103  CO2 25 25 28 28 26   GLUCOSE 91 115* 108* 106* 113*  BUN 11 9 12 13 20   CREATININE 0.98 1.03* 1.19* 1.26* 1.52*  CALCIUM 9.2 8.6* 9.0 9.3 9.2  MG  --  2.0  --   --   --   PHOS  --  3.4  --   --   --    Liver Function Tests: Recent Labs  Lab 10/13/23 0224  ALBUMIN 3.0*   No results for input(s): "LIPASE", "AMYLASE" in the last 168 hours. No results for input(s): "AMMONIA" in the last 168 hours. CBC: Recent  Labs  Lab 10/11/23 1344 10/12/23 0304 10/13/23 0224 10/14/23 0255  WBC 8.6 7.4 7.4 8.6  HGB 9.3* 8.2* 8.9* 9.8*  HCT 32.2* 28.2* 30.3* 34.1*  MCV 85.6 84.9 83.5 84.4  PLT 272 251 288 323   Cardiac Enzymes: No results for input(s): "CKTOTAL", "CKMB", "CKMBINDEX", "TROPONINI" in the last 168 hours. BNP: BNP (last 3 results) No results for input(s): "BNP" in the last 8760 hours.  ProBNP (last 3 results) Recent Labs    10/11/23 1344  PROBNP 1,179.0*    CBG: No results for input(s): "GLUCAP" in the last 168 hours.     Signed:  Lashana Spang  Drexel Gentles MD.  [facility] 10/15/2023, 9:24 AM

## 2023-10-15 NOTE — TOC Transition Note (Signed)
 Transition of Care [facility]) - Discharge Note   Patient Details  Name: Angie Figueroa MRN: [id number] Date of Birth: 21-Jan-1946  [facility]) CM/SW Contact:  Jennett Model, RN Phone Number: 10/15/2023, 10:24 AM   Clinical Narrative:    For dc today, spouse is taking her home.  She is active with [facility] for HHRN, HHPT.           Patient Goals and CMS Choice            Discharge Placement                       Discharge Plan and Services Additional resources added to the After Visit Summary for                                       Social Drivers of Health (SDOH) Interventions SDOH Screenings   Food Insecurity: No Food Insecurity (10/11/2023)  Housing: Low Risk  (10/11/2023)  Transportation Needs: No Transportation Needs (10/11/2023)  Utilities: Not At Risk (10/11/2023)  Social Connections: Socially Isolated (10/11/2023)  Tobacco Use: Low Risk  (10/11/2023)     Readmission Risk Interventions    10/15/2023   10:19 AM  Readmission Risk Prevention Plan  Transportation Screening Complete  PCP or Specialist Appt within 5-7 Days Complete  Home Care Screening Complete  Medication Review (RN CM) Complete

## 2023-10-15 NOTE — TOC CM/SW Note (Signed)
 Transition of Care [facility]) - Inpatient Brief Assessment   Patient Details  Name: Angie Figueroa MRN: [id number] Date of Birth: 09-09-1945  [facility]) CM/SW Contact:    Jennett Model, RN Phone Number: 10/15/2023, 10:21 AM   Clinical Narrative: From home with spouse, has PCP and insurance on file, states she is active with [facility] for [facility], she has DME at home.  States spouse will transport them home at [employer] and family is support system, states gets medications from [facility] in [place].  Pta self ambulatory with walker.   Transition of Care Asessment: Insurance and Status: Insurance coverage has been reviewed Patient has primary care physician: Yes Home environment has been reviewed: home with spouse Prior level of function:: indep Prior/Current Home Services: Current home services (w/chair, bsc, walker and a cane) Social Drivers of Health Review: SDOH reviewed no interventions necessary Readmission risk has been reviewed: Yes Transition of care needs: transition of care needs identified, TOC will continue to follow
